# Patient Record
Sex: Male | Born: 1960 | Race: White | Hispanic: No | Marital: Married | Smoking: Never smoker
Health system: Southern US, Community
[De-identification: ages and names within clinical notes are randomized; demographics above are authoritative.]

## PROBLEM LIST (undated history)

## (undated) DIAGNOSIS — K219 Gastro-esophageal reflux disease without esophagitis: Secondary | ICD-10-CM

## (undated) HISTORY — PX: TONSILLECTOMY: SUR1361

## (undated) HISTORY — PX: NASAL SINUS SURGERY: SHX719

---

## 2008-10-10 ENCOUNTER — Ambulatory Visit: Payer: Self-pay | Admitting: Otolaryngology

## 2015-10-16 ENCOUNTER — Other Ambulatory Visit: Payer: Self-pay | Admitting: Orthopedic Surgery

## 2015-10-16 DIAGNOSIS — M79672 Pain in left foot: Secondary | ICD-10-CM

## 2015-10-20 ENCOUNTER — Ambulatory Visit
Admission: RE | Admit: 2015-10-20 | Discharge: 2015-10-20 | Disposition: A | Discharge status: Home / Self Care | Payer: 59 | Source: Ambulatory Visit | Attending: Orthopedic Surgery | Admitting: Orthopedic Surgery

## 2015-10-20 DIAGNOSIS — M79672 Pain in left foot: Secondary | ICD-10-CM | POA: Diagnosis present

## 2015-10-27 ENCOUNTER — Other Ambulatory Visit: Payer: Self-pay | Admitting: Orthopedic Surgery

## 2015-10-27 DIAGNOSIS — M79672 Pain in left foot: Secondary | ICD-10-CM

## 2015-10-27 DIAGNOSIS — Z1389 Encounter for screening for other disorder: Secondary | ICD-10-CM

## 2015-11-13 ENCOUNTER — Ambulatory Visit
Admission: RE | Admit: 2015-11-13 | Discharge: 2015-11-13 | Disposition: A | Discharge status: Home / Self Care | Payer: 59 | Source: Ambulatory Visit | Attending: Orthopedic Surgery | Admitting: Orthopedic Surgery

## 2015-11-13 DIAGNOSIS — Z1389 Encounter for screening for other disorder: Secondary | ICD-10-CM

## 2015-11-13 DIAGNOSIS — M79672 Pain in left foot: Secondary | ICD-10-CM | POA: Diagnosis present

## 2015-11-13 DIAGNOSIS — M25475 Effusion, left foot: Secondary | ICD-10-CM | POA: Diagnosis not present

## 2015-11-13 DIAGNOSIS — M85872 Other specified disorders of bone density and structure, left ankle and foot: Secondary | ICD-10-CM | POA: Insufficient documentation

## 2016-07-05 ENCOUNTER — Encounter
Admission: RE | Admit: 2016-07-05 | Discharge: 2016-07-05 | Disposition: A | Discharge status: Home / Self Care | Payer: 59 | Source: Ambulatory Visit | Attending: Podiatry | Admitting: Podiatry

## 2016-07-05 DIAGNOSIS — Z01818 Encounter for other preprocedural examination: Secondary | ICD-10-CM | POA: Diagnosis present

## 2016-07-05 LAB — BASIC METABOLIC PANEL
ANION GAP: 7 (ref 5–15)
BUN: 15 mg/dL (ref 6–20)
CALCIUM: 9 mg/dL (ref 8.9–10.3)
CHLORIDE: 106 mmol/L (ref 101–111)
CO2: 25 mmol/L (ref 22–32)
CREATININE: 1.07 mg/dL (ref 0.61–1.24)
GFR calc non Af Amer: >60 mL/min (ref >60)
Glucose, Bld: 95 mg/dL (ref 65–99)
Potassium: 4.1 mmol/L (ref 3.5–5.1)
SODIUM: 138 mmol/L (ref 135–145)

## 2016-07-05 LAB — HEMOGLOBIN: Hemoglobin: 15 g/dL (ref 13.0–18.0)

## 2016-07-05 NOTE — Patient Instructions (Signed)
Your procedure is scheduled on: 07/09/16 Report to Day Surgery. 2nd floor medical mall entrance To find out your arrival time please call [phone] between 1PM - 3PM on 07/08/16.  Remember: Instructions that are not followed completely may result in serious medical risk, up to and including death, or upon the discretion of your surgeon and anesthesiologist your surgery may need to be rescheduled.    __X__ 1. Do not eat food or drink liquids after midnight. No gum chewing or hard candies.     __X__ 2. No Alcohol/NO SMOKING for 24 hours before or after surgery.   ____ 3. Bring all medications with you on the day of surgery if instructed.    __X__ 4. Notify your doctor if there is any change in your medical condition     (cold, fever, infections).     Do not wear jewelry, make-up, hairpins, clips or nail polish.  Do not wear lotions, powders, or perfumes.   Do not shave 48 hours prior to surgery. Men may shave face and neck.  Do not bring valuables to the hospital.    [facility] is not responsible for any belongings or valuables.               Contacts, dentures or bridgework may not be worn into surgery.  Leave your suitcase in the car. After surgery it may be brought to your room.  For patients admitted to the hospital, discharge time is determined by your                treatment team.   Patients discharged the day of surgery will not be allowed to drive home.   Please read over the following fact sheets that you were given:    CHG SOAP, PAIN, PRE-OP  ____ Take these medicines the morning of surgery with A SIP OF WATER:    1. NONE  2.   3.   4.  5.  6.  ____ Fleet Enema (as directed)   __X__ Use CHG Soap as directed  ____ Use inhalers on the day of surgery  ____ Stop metformin 2 days prior to surgery    ____ Take 1/2 of usual insulin dose the night before surgery and none on the morning of surgery.   ____ Stop Coumadin/Plavix/aspirin on   ____ Stop  Anti-inflammatories on    ____ Stop supplements until after surgery.    ____ Bring C-Pap to the hospital.

## 2016-07-09 ENCOUNTER — Encounter: Payer: Self-pay | Admitting: Anesthesiology

## 2016-07-09 ENCOUNTER — Ambulatory Visit: Payer: 59 | Admitting: Registered Nurse

## 2016-07-09 ENCOUNTER — Ambulatory Visit
Admission: RE | Admit: 2016-07-09 | Discharge: 2016-07-09 | Disposition: A | Discharge status: Home / Self Care | Payer: 59 | Source: Ambulatory Visit | Attending: Podiatry | Admitting: Podiatry

## 2016-07-09 ENCOUNTER — Encounter
Admission: RE | Disposition: A | Discharge status: Home / Self Care | Payer: Self-pay | Source: Ambulatory Visit | Attending: Podiatry

## 2016-07-09 DIAGNOSIS — G8929 Other chronic pain: Secondary | ICD-10-CM | POA: Diagnosis not present

## 2016-07-09 DIAGNOSIS — M24575 Contracture, left foot: Secondary | ICD-10-CM | POA: Insufficient documentation

## 2016-07-09 DIAGNOSIS — M2042 Other hammer toe(s) (acquired), left foot: Secondary | ICD-10-CM | POA: Insufficient documentation

## 2016-07-09 DIAGNOSIS — M7752 Other enthesopathy of left foot: Secondary | ICD-10-CM | POA: Diagnosis not present

## 2016-07-09 HISTORY — PX: HAMMER TOE SURGERY: SHX385

## 2016-07-09 SURGERY — CORRECTION, HAMMER TOE
Anesthesia: General | Site: Toe | Laterality: Left | Wound class: Clean

## 2016-07-09 MED ORDER — LIDOCAINE HCL (CARDIAC) 20 MG/ML IV SOLN
INTRAVENOUS | Status: DC | PRN
  Administered 2016-07-09: 100 mg via INTRAVENOUS

## 2016-07-09 MED ORDER — ONDANSETRON HCL 4 MG/2ML IJ SOLN: 4.0000 mg | Freq: Once | INTRAMUSCULAR | Status: DC | PRN

## 2016-07-09 MED ORDER — CEFAZOLIN SODIUM-DEXTROSE 2-4 GM/100ML-% IV SOLN
2.0000 g | Freq: Once | INTRAVENOUS | Status: AC
Start: 2016-07-09 — End: 2016-07-09
  Administered 2016-07-09: 2 g via INTRAVENOUS

## 2016-07-09 MED ORDER — ONDANSETRON HCL 4 MG/2ML IJ SOLN
INTRAMUSCULAR | Status: DC | PRN
  Administered 2016-07-09: 4 mg via INTRAVENOUS

## 2016-07-09 MED ORDER — LACTATED RINGERS IV SOLN
INTRAVENOUS | Status: DC
  Administered 2016-07-09: 07:00:00 via INTRAVENOUS

## 2016-07-09 MED ORDER — GLYCOPYRROLATE 0.2 MG/ML IJ SOLN
INTRAMUSCULAR | Status: DC | PRN
  Administered 2016-07-09: 0.2 mg via INTRAVENOUS

## 2016-07-09 MED ORDER — BUPIVACAINE HCL 0.5 % IJ SOLN
INTRAMUSCULAR | Status: DC | PRN
Start: 2016-07-09 — End: 2016-07-09
  Administered 2016-07-09: 8.5 mL

## 2016-07-09 MED ORDER — MIDAZOLAM HCL 2 MG/2ML IJ SOLN
INTRAMUSCULAR | Status: DC | PRN
  Administered 2016-07-09: 2 mg via INTRAVENOUS

## 2016-07-09 MED ORDER — LIDOCAINE HCL (PF) 1 % IJ SOLN
INTRAMUSCULAR | Status: AC
  Filled 2016-07-09: qty 30

## 2016-07-09 MED ORDER — BUPIVACAINE HCL (PF) 0.5 % IJ SOLN
INTRAMUSCULAR | Status: AC
  Filled 2016-07-09: qty 30

## 2016-07-09 MED ORDER — PHENYLEPHRINE HCL 10 MG/ML IJ SOLN
INTRAMUSCULAR | Status: DC | PRN
  Administered 2016-07-09 (×3): 100 ug via INTRAVENOUS
  Administered 2016-07-09 (×2): 50 ug via INTRAVENOUS
  Administered 2016-07-09: 100 ug via INTRAVENOUS

## 2016-07-09 MED ORDER — LIDOCAINE HCL 1 % IJ SOLN
INTRAMUSCULAR | Status: DC | PRN
  Administered 2016-07-09: 3.5 mL

## 2016-07-09 MED ORDER — CEFAZOLIN SODIUM-DEXTROSE 2-4 GM/100ML-% IV SOLN
INTRAVENOUS | Status: AC
  Administered 2016-07-09: 2 g via INTRAVENOUS
  Filled 2016-07-09: qty 100

## 2016-07-09 MED ORDER — FAMOTIDINE 20 MG PO TABS
ORAL_TABLET | ORAL | Status: AC
  Administered 2016-07-09: 20 mg via ORAL
  Filled 2016-07-09: qty 1

## 2016-07-09 MED ORDER — PROPOFOL 10 MG/ML IV BOLUS
INTRAVENOUS | Status: DC | PRN
  Administered 2016-07-09: 180 mg via INTRAVENOUS
  Administered 2016-07-09: 40 mg via INTRAVENOUS
  Administered 2016-07-09: 20 mg via INTRAVENOUS

## 2016-07-09 MED ORDER — OXYCODONE-ACETAMINOPHEN 7.5-325 MG PO TABS: 1.0000 | ORAL_TABLET | ORAL | 0 refills | Status: DC | PRN

## 2016-07-09 MED ORDER — FAMOTIDINE 20 MG PO TABS
20.0000 mg | ORAL_TABLET | Freq: Once | ORAL | Status: AC
  Administered 2016-07-09: 20 mg via ORAL

## 2016-07-09 MED ORDER — FENTANYL CITRATE (PF) 100 MCG/2ML IJ SOLN
INTRAMUSCULAR | Status: DC | PRN
  Administered 2016-07-09: 50 ug via INTRAVENOUS

## 2016-07-09 MED ORDER — FENTANYL CITRATE (PF) 100 MCG/2ML IJ SOLN: 25.0000 ug | INTRAMUSCULAR | Status: DC | PRN

## 2016-07-09 SURGICAL SUPPLY — 42 items
BANDAGE ELASTIC 4 LF NS (GAUZE/BANDAGES/DRESSINGS) ×2 IMPLANT
BANDAGE STRETCH 3X4.1 STRL (GAUZE/BANDAGES/DRESSINGS) ×2 IMPLANT
BLADE MED AGGRESSIVE (BLADE) ×1 IMPLANT
BLADE OSC/SAGITTAL MD 5.5X18 (BLADE) ×2 IMPLANT
BLADE SURG MINI STRL (BLADE) ×2 IMPLANT
BNDG CMPR MED 5X4 ELC HKLP NS (GAUZE/BANDAGES/DRESSINGS) ×1
BNDG ESMARK 4X12 TAN STRL LF (GAUZE/BANDAGES/DRESSINGS) ×2 IMPLANT
BNDG GAUZE 4.5X4.1 6PLY STRL (MISCELLANEOUS) ×2 IMPLANT
CANISTER SUCT 1200ML W/VALVE (MISCELLANEOUS) ×2 IMPLANT
COVER PIN YLW 0.028-062 (MISCELLANEOUS) ×8 IMPLANT
CUFF TOURN 18 STER (MISCELLANEOUS) ×1 IMPLANT
CUFF TOURN DUAL PL 12 NO SLV (MISCELLANEOUS) ×2 IMPLANT
DRAPE FLUOR MINI C-ARM 54X84 (DRAPES) ×2 IMPLANT
DURAPREP 26ML APPLICATOR (WOUND CARE) ×2 IMPLANT
ELECT REM PT RETURN 9FT ADLT (ELECTROSURGICAL) ×2
ELECTRODE REM PT RTRN 9FT ADLT (ELECTROSURGICAL) ×1 IMPLANT
GAUZE PETRO XEROFOAM 1X8 (MISCELLANEOUS) ×2 IMPLANT
GAUZE SPONGE 4X4 12PLY STRL (GAUZE/BANDAGES/DRESSINGS) ×2 IMPLANT
GLOVE BIO SURGEON STRL SZ8 (GLOVE) ×2 IMPLANT
GOWN STRL REUS W/ TWL LRG LVL3 (GOWN DISPOSABLE) ×2 IMPLANT
GOWN STRL REUS W/TWL LRG LVL3 (GOWN DISPOSABLE) ×4
KIT RM TURNOVER STRD PROC AR (KITS) ×2 IMPLANT
LABEL OR SOLS (LABEL) ×2 IMPLANT
NDL FILTER BLUNT 18X1 1/2 (NEEDLE) ×1 IMPLANT
NDL HYPO 25X1 1.5 SAFETY (NEEDLE) ×1 IMPLANT
NEEDLE FILTER BLUNT 18X 1/2SAF (NEEDLE) ×1
NEEDLE FILTER BLUNT 18X1 1/2 (NEEDLE) ×1 IMPLANT
NEEDLE HYPO 25X1 1.5 SAFETY (NEEDLE) ×2 IMPLANT
NS IRRIG 500ML POUR BTL (IV SOLUTION) ×2 IMPLANT
PACK EXTREMITY ARMC (MISCELLANEOUS) ×2 IMPLANT
PAD PREP 24X41 OB/GYN DISP (PERSONAL CARE ITEMS) ×2 IMPLANT
PENCIL ELECTRO HAND CTR (MISCELLANEOUS) ×2 IMPLANT
RASP SM TEAR CROSS CUT (RASP) ×1 IMPLANT
SPLINT FAST PLASTER 5X30 (CAST SUPPLIES) ×1
SPLINT PLASTER CAST FAST 5X30 (CAST SUPPLIES) ×1 IMPLANT
STOCKINETTE STRL 6IN 960660 (GAUZE/BANDAGES/DRESSINGS) ×2 IMPLANT
STRIP CLOSURE SKIN 1/4X4 (GAUZE/BANDAGES/DRESSINGS) ×2 IMPLANT
SUT ETHILON 5-0 FS-2 18 BLK (SUTURE) ×2 IMPLANT
SUT VIC AB 4-0 FS2 27 (SUTURE) ×2 IMPLANT
SYRINGE 10CC LL (SYRINGE) ×2 IMPLANT
WIRE Z .045 C-WIRE SPADE TIP (WIRE) ×2 IMPLANT
WIRE Z .062 C-WIRE SPADE TIP (WIRE) ×1 IMPLANT

## 2016-07-09 NOTE — Anesthesia Procedure Notes (Signed)
Procedure Name: LMA Insertion Performed by: Layci Stenglein Pre-anesthesia Checklist: Patient identified, Patient being monitored, Timeout performed, Emergency Drugs available and Suction available Patient Re-evaluated:Patient Re-evaluated prior to inductionOxygen Delivery Method: Circle system utilized Preoxygenation: Pre-oxygenation with 100% oxygen Intubation Type: IV induction LMA: LMA inserted LMA Size: 4.0 Tube type: Oral Number of attempts: 1 Placement Confirmation: positive ETCO2 and breath sounds checked- equal and bilateral Tube secured with: Tape Dental Injury: Teeth and Oropharynx as per pre-operative assessment        

## 2016-07-09 NOTE — Transfer of Care (Signed)
Immediate Anesthesia Transfer of Care Note  Patient: Thomas Parks  Procedure(s) Performed: Procedure(s): HAMMER TOE CORRECTION LEFT 2ND TOE (Left)  Patient Location: PACU  Anesthesia Type:General  Level of Consciousness: sedated and responds to stimulation  Airway & Oxygen Therapy: Patient Spontanous Breathing and Patient connected to face mask oxygen  Post-op Assessment: Report given to RN and Post -op Vital signs reviewed and stable  Post vital signs: Reviewed and stable  Last Vitals:  Vitals:   07/09/16 0638 07/09/16 0850  BP: 110/77 119/81  Pulse: 64 (!) 58  Resp: 16 11  Temp: 36.8 C     Last Pain:  Vitals:   07/09/16 0638  TempSrc: Tympanic  PainSc: 1          Complications: No apparent anesthesia complications

## 2016-07-09 NOTE — Anesthesia Preprocedure Evaluation (Addendum)
Anesthesia Evaluation  Patient identified by MRN, date of birth, ID band Patient awake    Reviewed: Allergy & Precautions, NPO status , Patient's Chart, lab work & pertinent test results, reviewed documented beta blocker date and time   Airway Mallampati: II  TM Distance: >3 FB     Dental  (+) Chipped   Pulmonary           Cardiovascular      Neuro/Psych    GI/Hepatic   Endo/Other    Renal/GU      Musculoskeletal   Abdominal   Peds  Hematology   Anesthesia Other Findings EKG ok.  Reproductive/Obstetrics                            Anesthesia Physical Anesthesia Plan  ASA: II  Anesthesia Plan: General   Post-op Pain Management:    Induction: Intravenous  Airway Management Planned: LMA  Additional Equipment:   Intra-op Plan:   Post-operative Plan:   Informed Consent: I have reviewed the patients History and Physical, chart, labs and discussed the procedure including the risks, benefits and alternatives for the proposed anesthesia with the patient or authorized representative who has indicated his/her understanding and acceptance.     Plan Discussed with: CRNA  Anesthesia Plan Comments:         Anesthesia Quick Evaluation

## 2016-07-09 NOTE — Anesthesia Postprocedure Evaluation (Signed)
Anesthesia Post Note  Patient: Thomas Parks  Procedure(s) Performed: Procedure(s) (LRB): HAMMER TOE CORRECTION LEFT 2ND TOE (Left)  Patient location during evaluation: PACU Anesthesia Type: General Level of consciousness: awake Pain management: pain level controlled Vital Signs Assessment: post-procedure vital signs reviewed and stable Respiratory status: spontaneous breathing Cardiovascular status: stable Anesthetic complications: no    Last Vitals:  Vitals:   07/09/16 0848 07/09/16 0850  BP:  119/81  Pulse: 71 (!) 58  Resp: 14 11  Temp: 36.5 C     Last Pain:  Vitals:   07/09/16 0638  TempSrc: Tympanic  PainSc: 1                  VAN STAVEREN,Balraj Brayfield

## 2016-07-09 NOTE — H&P (Signed)
H and P has been reviewed and no changes are noted.  

## 2016-07-09 NOTE — Op Note (Signed)
Operative note   Surgeon: Dr. Recardo Evangelist, DPM.    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 Hammertoe correction with K wire fixation through the PIP joint arthrodesis site as well as through the metatarsophalangeal joint.          EBL: Less than 5 cc    Anesthesia:general delivered by the anesthesia team. Local anesthesia was delivered by me preoperatively with 7 cc of 1% lidocaine and Marcaine 0.5% plain mixed half and half. At the end of the case 8 cc of 0.5% Marcaine plain was delivered to the operative site base.    Hemostasis: Ankle tourniquet 250 mmHg pressure for 40 minutes    Specimen: None    Complications: None    Operative indications: Patient is a chronic pain to the second toe and second metatarsophalangeal joints unresponsive to conservative care.    Procedure:  Patient was brought into the OR and placed on the operating table in thesupine position. After anesthesia was obtained theleft lower extremity was prepped and draped in usual sterile fashion.  Operative Report: This time to his directed to the dorsum of the second toe and second metatarsophalangeal joint. Curvilinear incision was made over the MTPJ and extended into 2 semielliptical incisions over the PIPJ. This ellipse of skin was then removed and incision was deepened sharp blunt dissection. Bovied as required. This time to his directed to the extensor tendon and the MTPJ area. Extensor tendon was identified and extensor hood release was performed. The tendon was then retracted laterally and a dorsal and medial capsulotomy were performed over the metatarsophalangeal joint. A McGlamry elevator was then entered in the joint in order to free capsular tissue from any, scarring and contractures in the region to prevent plantar flexion. Appear to be intact plantarly. Next Attention directed to the PIP  joint initially where the extensor tendon was incised transversely reflected proximally and distally and the articular cartilage was removed from both the proximal phalanx head and the middle phalanx base. A 0.045 K wires and drilled through the middle phalanx distally out the end of the toe and then retrograded in the proximal phalanx care to take and remove soft tissue interposition in between the fusion site of the PIPJ. This area was checked and good position of the joint and bone was noted. Small bony prominence on the lateral base of the proximal phalanx was resected and rasped smoothly. At this time it was seen the toe still to contract slightly due to the tight extensor tendon. At this time attention was directed proximal to the MTP joint at the proximal portion of the dorsal incision. Extensor tendon was identified and extensor sheath was released and a Z-plasty lengthening was performed extensor tendon. This was sutured together with 4-0 Vicryl. The tendon sheath was repaired with 4 Vicryl simple interrupted sutures as well. This time the 0.5 K wire was addressed again and the toe was held in a corrected position with slight overcorrection to the plantar flexion of the toe and then removed retrograded into the metatarsal head and shaft. Toe position was seen to be in very good shape at this point. X-rays were taken in good position and correction of the deformity were noted. This time the areas were copiously irrigated and deep superficial fascial layers were closed with 4 Vicryl in continuous stitch. The extensor tendon over the PIP joint  was closed with 3 simple interrupted sutures of 4 Vicryl. Skin was closed in its entirety with a subcuticular 4 Vicryl stitch.  This time there is block 0.5% Marcaine plain at the base of the operative sites. Sterile compressive dressings placed across wound consisting of Steri-Strips gauze 4 x 4's Kling Kerlix. A posterior splint was placed on the left foot leg in the  operating room.    Patient tolerated the procedure and anesthesia well.  Was transported from the OR to the PACU with all vital signs stable and vascular status intact. To be discharged per routine protocol.  Will follow up in approximately 1 week in the outpatient clinic.

## 2016-07-09 NOTE — Anesthesia Postprocedure Evaluation (Signed)
Anesthesia Post Note  Patient: Thomas GrippeMitchell A Parks  Procedure(s) Performed: Procedure(s) (LRB): HAMMER TOE CORRECTION LEFT 2ND TOE (Left)  Patient location during evaluation: PACU Anesthesia Type: General Level of consciousness: awake and alert Pain management: pain level controlled Vital Signs Assessment: post-procedure vital signs reviewed and stable Respiratory status: spontaneous breathing, nonlabored ventilation, respiratory function stable and patient connected to nasal cannula oxygen Cardiovascular status: blood pressure returned to baseline and stable Postop Assessment: no signs of nausea or vomiting Anesthetic complications: no    Last Vitals:  Vitals:   07/09/16 0918 07/09/16 0929  BP: 119/85 115/76  Pulse: 70 64  Resp: (!) 24 16  Temp: 36.6 C 36.2 C    Last Pain:  Vitals:   07/09/16 1000  TempSrc:   PainSc: 0-No pain                 Zeplin Aleshire S

## 2016-07-09 NOTE — Discharge Instructions (Signed)
AMBULATORY SURGERY  DISCHARGE INSTRUCTIONS   1) The drugs that you were given will stay in your system until tomorrow so for the next 24 hours you should not:  A) Drive an automobile B) Make any legal decisions C) Drink any alcoholic beverage   2) You may resume regular meals tomorrow.  Today it is better to start with liquids and gradually work up to solid foods.  You may eat anything you prefer, but it is better to start with liquids, then soup and crackers, and gradually work up to solid foods.   3) Please notify your doctor immediately if you have any unusual bleeding, trouble breathing, redness and pain at the surgery site, drainage, fever, or pain not relieved by medication.    4) Additional Instructions:        Please contact your physician with any problems or Same Day Surgery at [phone], Monday through Friday 6 am to 4 pm, or [place] at [facility] Main number at [phone].[facility] [place] [facility]  POST OPERATIVE INSTRUCTIONS FOR DR. TROXLER AND DR. [facility]   1. Take your medication as prescribed.  Pain medication should be taken only as needed. Recommend taking a pain pill as soon as the anesthesia start to wear off and then as needed.  2. Keep the dressing clean, dry and intact.  3. Keep your foot elevated above the heart level for the first 48 hours.  4. Walking to the bathroom and brief periods of walking are acceptable, unless we have instructed you to be non-weight bearing. Recommend using a walker or assistance for balance during the first couple days of ambulating. Recommend sitting up for at least a minutes in bed before trying to walk to allow blood pressure 2 stabilize  5. Always wear your post-op shoe when walking.  Always use your crutches if you are to be non-weight bearing. Can use crutches or walker for assistance with walking initially.  6. Do not take a shower. Baths are  permissible as long as the foot is kept out of the water. Shower protectors can be purchased at most medical supply store's.  7. Every hour you are awake:  - Bend your knee 15 times. - Flex foot 15 times - Massage calf 15 times  8. Call [facility] [phone]) if any of the following problems occur: - You develop a temperature or fever. - The bandage becomes saturated with blood. - Medication does not stop your pain. - Injury of the foot occurs. - Any symptoms of infection including redness, odor, or red streaks running from wound. -

## 2016-07-09 NOTE — Progress Notes (Signed)
Can wiggle toes   No complaints of pain

## 2017-10-26 ENCOUNTER — Emergency Department: Payer: Managed Care, Other (non HMO)

## 2017-10-26 ENCOUNTER — Other Ambulatory Visit: Payer: Self-pay

## 2017-10-26 ENCOUNTER — Emergency Department
Admission: EM | Admit: 2017-10-26 | Discharge: 2017-10-26 | Disposition: A | Discharge status: Home / Self Care | Payer: Managed Care, Other (non HMO) | Attending: Emergency Medicine | Admitting: Emergency Medicine

## 2017-10-26 ENCOUNTER — Encounter: Payer: Self-pay | Admitting: Emergency Medicine

## 2017-10-26 DIAGNOSIS — Z79899 Other long term (current) drug therapy: Secondary | ICD-10-CM | POA: Insufficient documentation

## 2017-10-26 DIAGNOSIS — R1011 Right upper quadrant pain: Secondary | ICD-10-CM

## 2017-10-26 DIAGNOSIS — K8020 Calculus of gallbladder without cholecystitis without obstruction: Secondary | ICD-10-CM | POA: Insufficient documentation

## 2017-10-26 DIAGNOSIS — R112 Nausea with vomiting, unspecified: Secondary | ICD-10-CM | POA: Diagnosis not present

## 2017-10-26 DIAGNOSIS — R109 Unspecified abdominal pain: Secondary | ICD-10-CM | POA: Diagnosis present

## 2017-10-26 LAB — CBC WITH DIFFERENTIAL/PLATELET
BASOS PCT: 1 %
Basophils Absolute: 0.1 10*3/uL (ref 0–0.1)
Eosinophils Absolute: 0.2 10*3/uL (ref 0–0.7)
Eosinophils Relative: 2 %
HEMATOCRIT: 45.3 % (ref 40.0–52.0)
Hemoglobin: 15.6 g/dL (ref 13.0–18.0)
LYMPHS PCT: 18 %
Lymphs Abs: 2.3 10*3/uL (ref 1.0–3.6)
MCH: 30 pg (ref 26.0–34.0)
MCHC: 34.5 g/dL (ref 32.0–36.0)
MCV: 86.9 fL (ref 80.0–100.0)
MONO ABS: 0.8 10*3/uL (ref 0.2–1.0)
MONOS PCT: 6 %
NEUTROS ABS: 9.8 10*3/uL — AB (ref 1.4–6.5)
Neutrophils Relative %: 73 %
Platelets: 292 10*3/uL (ref 150–440)
RBC: 5.21 MIL/uL (ref 4.40–5.90)
RDW: 13.5 % (ref 11.5–14.5)
WBC: 13.2 10*3/uL — ABNORMAL HIGH (ref 3.8–10.6)

## 2017-10-26 LAB — LIPASE, BLOOD: LIPASE: 34 U/L (ref 11–51)

## 2017-10-26 LAB — COMPREHENSIVE METABOLIC PANEL
ALT: 45 U/L (ref 17–63)
ANION GAP: 13 (ref 5–15)
AST: 41 U/L (ref 15–41)
Albumin: 4.5 g/dL (ref 3.5–5.0)
Alkaline Phosphatase: 91 U/L (ref 38–126)
BILIRUBIN TOTAL: 1 mg/dL (ref 0.3–1.2)
BUN: 17 mg/dL (ref 6–20)
CO2: 22 mmol/L (ref 22–32)
Calcium: 9.5 mg/dL (ref 8.9–10.3)
Chloride: 103 mmol/L (ref 101–111)
Creatinine, Ser: 1.44 mg/dL — ABNORMAL HIGH (ref 0.61–1.24)
GFR calc Af Amer: >60 mL/min (ref >60)
GFR, EST NON AFRICAN AMERICAN: 53 mL/min — AB (ref >60)
Glucose, Bld: 167 mg/dL — ABNORMAL HIGH (ref 65–99)
POTASSIUM: 3.3 mmol/L — AB (ref 3.5–5.1)
Sodium: 138 mmol/L (ref 135–145)
TOTAL PROTEIN: 8.2 g/dL — AB (ref 6.5–8.1)

## 2017-10-26 LAB — TROPONIN I
Troponin I: <0.03 ng/mL (ref <0.03)
Troponin I: <0.03 ng/mL (ref <0.03)

## 2017-10-26 MED ORDER — IOPAMIDOL (ISOVUE-300) INJECTION 61%
100.0000 mL | Freq: Once | INTRAVENOUS | Status: AC | PRN
  Administered 2017-10-26: 100 mL via INTRAVENOUS

## 2017-10-26 MED ORDER — ONDANSETRON 4 MG PO TBDP: 4.0000 mg | ORAL_TABLET | Freq: Three times a day (TID) | ORAL | 0 refills | Status: DC | PRN

## 2017-10-26 MED ORDER — SODIUM CHLORIDE 0.9 % IV BOLUS (SEPSIS)
1000.0000 mL | Freq: Once | INTRAVENOUS | Status: AC
  Administered 2017-10-26: 1000 mL via INTRAVENOUS

## 2017-10-26 MED ORDER — KETOROLAC TROMETHAMINE 30 MG/ML IJ SOLN
30.0000 mg | Freq: Once | INTRAMUSCULAR | Status: AC
  Administered 2017-10-26: 30 mg via INTRAVENOUS
  Filled 2017-10-26: qty 1

## 2017-10-26 MED ORDER — HYDROMORPHONE HCL 1 MG/ML IJ SOLN
INTRAMUSCULAR | Status: AC
  Filled 2017-10-26: qty 1

## 2017-10-26 MED ORDER — HYDROMORPHONE HCL 1 MG/ML IJ SOLN
1.0000 mg | Freq: Once | INTRAMUSCULAR | Status: AC
  Administered 2017-10-26: 1 mg via INTRAVENOUS

## 2017-10-26 MED ORDER — OXYCODONE-ACETAMINOPHEN 5-325 MG PO TABS: 2.0000 | ORAL_TABLET | ORAL | 0 refills | Status: DC | PRN

## 2017-10-26 MED ORDER — HYDROMORPHONE HCL 1 MG/ML IJ SOLN
1.0000 mg | Freq: Once | INTRAMUSCULAR | Status: AC
  Administered 2017-10-26: 1 mg via INTRAVENOUS
  Filled 2017-10-26: qty 1

## 2017-10-26 MED ORDER — MORPHINE SULFATE (PF) 4 MG/ML IV SOLN
4.0000 mg | Freq: Once | INTRAVENOUS | Status: AC
  Administered 2017-10-26: 4 mg via INTRAVENOUS
  Filled 2017-10-26: qty 1

## 2017-10-26 MED ORDER — ONDANSETRON HCL 4 MG/2ML IJ SOLN
4.0000 mg | Freq: Once | INTRAMUSCULAR | Status: AC
  Administered 2017-10-26: 4 mg via INTRAVENOUS
  Filled 2017-10-26: qty 2

## 2017-10-26 NOTE — ED Notes (Signed)
Pt returned from ultrasound

## 2017-10-26 NOTE — ED Provider Notes (Signed)
[facility] Emergency Department Provider Note   ____________________________________________   First MD Initiated Contact with Patient 10/26/17 0016     (approximate)  I have reviewed the triage vital signs and the nursing notes.   HISTORY  Chief Complaint Abdominal Pain    HPI Thomas Parks is a 57 y.o. male who comes into the hospital today with some abdominal pain.  He reports that it started around 9:00 this evening.  The pain is in his right upper quadrant and he says it goes across to the center of his abdomen.  The patient states that he had some spaghetti for dinner.  He has had some nausea and vomiting as well.  He has never had this problem before.  The patient reports that his pain is a 10 out of 10 in intensity.  He came into the hospital today for evaluation of his symptoms.  History reviewed. No pertinent past medical history.  Patient Active Problem List   Diagnosis Date Noted  . Calculus of gallbladder without cholecystitis without obstruction     Past Surgical History:  Procedure Laterality Date  . HAMMER TOE SURGERY Left 07/09/2016   Procedure: HAMMER TOE CORRECTION LEFT 2ND TOE;  Surgeon: Recardo EvangelistMatthew Troxler, DPM;  Location: [facility] ORS;  Service: Podiatry;  Laterality: Left;  . NASAL SINUS SURGERY    . NASAL SINUS SURGERY    . TONSILLECTOMY      Prior to Admission medications   Medication Sig Start Date End Date Taking? Authorizing Provider  b complex vitamins tablet Take 1 tablet by mouth daily.   Yes [provider]  Misc Natural Products (OSTEO BI-FLEX ADV TRIPLE ST PO) Take 1 capsule by mouth daily.   Yes [provider]  vitamin C (ASCORBIC ACID) 500 MG tablet Take 500 mg by mouth daily.   Yes [provider]  ondansetron (ZOFRAN ODT) 4 MG disintegrating tablet Take 1 tablet (4 mg total) by mouth every 8 (eight) hours as needed for nausea or vomiting. 10/26/17   Rebecka ApleyWebster, [name], MD    oxyCODONE-acetaminophen (PERCOCET/ROXICET) 5-325 MG tablet Take 2 tablets by mouth every 4 (four) hours as needed for severe pain. 10/26/17   Rebecka ApleyWebster, [name], MD    Allergies Banana  No family history on file.  Social History Social History   Tobacco Use  . Smoking status: Never Smoker  . Smokeless tobacco: Never Used  Substance Use Topics  . Alcohol use: Yes    Comment: socially  . Drug use: No    Review of Systems  Constitutional: No fever/chills Eyes: No visual changes. ENT: No sore throat. Cardiovascular: Denies chest pain. Respiratory: Denies shortness of breath. Gastrointestinal:  abdominal pain   nausea,  vomiting.  No diarrhea.  No constipation. Genitourinary: Negative for dysuria. Musculoskeletal: Negative for back pain. Skin: Negative for rash. Neurological: Negative for headaches, focal weakness or numbness.  ____________________________________________   PHYSICAL EXAM:  VITAL SIGNS: ED Triage Vitals  Enc Vitals Group     BP 10/26/17 0030 136/82     Pulse Rate 10/26/17 0030 75     Resp 10/26/17 0030 14     Temp --      Temp src --      SpO2 10/26/17 0030 100 %     Weight 10/26/17 0004 190 lb (86.2 kg)     Height 10/26/17 0004 5\' 9"  (1.753 m)     Head Circumference --      Peak Flow --  Pain Score 10/26/17 0004 10     Pain Loc --      Pain Edu? --      Excl. in GC? --     Constitutional: Alert and oriented. Well appearing and in moderate distress. Eyes: Conjunctivae are normal. PERRL. EOMI. Head: Atraumatic. Nose: No congestion/rhinnorhea. Mouth/Throat: Mucous membranes are moist.  Oropharynx non-erythematous. Cardiovascular: Normal rate, regular rhythm. Grossly normal heart sounds.  Good peripheral circulation. Respiratory: Normal respiratory effort.  No retractions. Lungs CTAB. Gastrointestinal: Soft with some RUQ and epigastric pain to palpation. No distention. Positive bowel sounds Musculoskeletal: No lower extremity tenderness  nor edema.   Neurologic:  Normal speech and language.  Skin:  Skin is warm, dry and intact.  Psychiatric: Mood and affect are normal.   ____________________________________________   LABS (all labs ordered are listed, but only abnormal results are displayed)  Labs Reviewed  CBC WITH DIFFERENTIAL/PLATELET - Abnormal; Notable for the following components:      Result Value   WBC 13.2 (*)    Neutro Abs 9.8 (*)    All other components within normal limits  COMPREHENSIVE METABOLIC PANEL - Abnormal; Notable for the following components:   Potassium 3.3 (*)    Glucose, Bld 167 (*)    Creatinine, Ser 1.44 (*)    Total Protein 8.2 (*)    GFR calc non Af Amer 53 (*)    All other components within normal limits  LIPASE, BLOOD  TROPONIN I  TROPONIN I   ____________________________________________  EKG  ED ECG REPORT I, Rebecka Apley, the attending physician, personally viewed and interpreted this ECG.   Date: 10/26/2017  EKG Time: 0012  Rate: 73  Rhythm: normal sinus rhythm  Axis: normal  Intervals:none  ST&T Change: none  ____________________________________________  RADIOLOGY  ED MD interpretation:  [place] abd RUQ: cholelithiasis with no cholecystitis                                      CT abd and pelvis: NAD  Official radiology report(s): Ct Abdomen Pelvis W Contrast  Result Date: 10/26/2017 CLINICAL DATA:  Upper abdominal pain, nausea and vomiting beginning this evening. EXAM: CT ABDOMEN AND PELVIS WITH CONTRAST TECHNIQUE: Multidetector CT imaging of the abdomen and pelvis was performed using the standard protocol following bolus administration of intravenous contrast. CONTRAST:  ISOVUE-300 IOPAMIDOL (ISOVUE-300) INJECTION 61% COMPARISON:  Abdominal ultrasound October 26, 2017 at 0109 hours. FINDINGS: LOWER CHEST: Dependent atelectasis. Included heart size is normal. No pericardial effusion. HEPATOBILIARY: Diffusely hypodense liver compatible with steatosis, mild  focal fatty sparing about the gallbladder fossa. Known gallstones are not apparent by CT. No CT findings of acute cholecystitis. PANCREAS: Normal. SPLEEN: Normal. ADRENALS/URINARY TRACT: Kidneys are orthotopic, demonstrating symmetric enhancement. No nephrolithiasis, hydronephrosis or solid renal masses. The unopacified ureters are normal in course and caliber. Delayed imaging through the kidneys demonstrates symmetric prompt contrast excretion within the proximal urinary collecting system. Urinary bladder is partially distended and unremarkable. Normal adrenal glands. STOMACH/BOWEL: The stomach, small and large bowel are normal in course and caliber without inflammatory changes. A few scattered colonic diverticula evident. Small probable duodenal diverticulum anteriorly directed. Normal appendix. VASCULAR/LYMPHATIC: Aortoiliac vessels are normal in course and caliber. No lymphadenopathy by CT size criteria. REPRODUCTIVE: Prostatomegaly invading base of bladder. OTHER: No intraperitoneal free fluid or free air. Small bilateral fat containing inguinal hernias. MUSCULOSKELETAL: Nonacute. IMPRESSION: 1. No acute intra-or  pelvic process. Known cholelithiasis not apparent by CT. 2. Prostatomegaly. Electronically Signed   By: Awilda Metro M.D.   On: 10/26/2017 03:12   US Abdomen Limited Ruq  Result Date: 10/26/2017 CLINICAL DATA:  Upper abdominal pain with nausea and vomiting x4 hours. EXAM: ULTRASOUND ABDOMEN LIMITED RIGHT UPPER QUADRANT COMPARISON:  None. FINDINGS: Gallbladder: Layering gallstones and biliary sludge are identified within the physiologically distended gallbladder. No pericholecystic fluid is noted. No wall thickening is seen. Patient was medicated and therefore accurate assessment of a sonographic [name] sign was limited. Common bile duct: Diameter: 3.5 mm and normal Liver: No focal lesion identified. Within normal limits in parenchymal echogenicity. Portal vein is patent on color Doppler  imaging with normal direction of blood flow towards the liver. IMPRESSION: Uncomplicated cholelithiasis and biliary sludge. Electronically Signed   By: [name] M.D.   On: 10/26/2017 02:00    ____________________________________________   PROCEDURES  Procedure(s) performed: None  Procedures  Critical Care performed: No  ____________________________________________   INITIAL IMPRESSION / ASSESSMENT AND PLAN / ED COURSE  As part of my medical decision making, I reviewed the following data within the electronic MEDICAL RECORD NUMBER Notes from prior ED visits and [place] Controlled Substance Database   This is a 57 year old male who comes into the hospital today with some abdominal pain.  My differential diagnosis includes cholecystitis, cholelithiasis, pancreatitis  The patient initially arrived in some severe pain.  We first gave him some morphine and Zofran and then gave him a milligram of Dilaudid.  The patient continued to have pain so he received a second dose of Dilaudid.  The patient was sent for an ultrasound.  After he returned he still had some pain so he received 1/3 mg of Dilaudid.  The patient's ultrasound showed uncomplicated cholelithiasis.  I then sent the patient for a CT scan since he had been having so much significant pain.  The patient CT scan was unremarkable.  The patient does have a white blood cell count of 13.2 but the remaining blood work is unremarkable.  I also checked 2 troponins which were negative.  The patient will be discharged home to follow-up with surgery.  I did have the patient discuss his symptoms with Dr. Everlene Farrier, but the patient's pain was improved at that time.  The patient will follow up with him in the office.      ____________________________________________   FINAL CLINICAL IMPRESSION(S) / ED DIAGNOSES  Final diagnoses:  Abdominal pain  Right upper quadrant abdominal pain  Calculus of gallbladder without cholecystitis without obstruction    Non-intractable vomiting with nausea, unspecified vomiting type     ED Discharge Orders        Ordered    oxyCODONE-acetaminophen (PERCOCET/ROXICET) 5-325 MG tablet  Every 4 hours PRN     10/26/17 0520    ondansetron (ZOFRAN ODT) 4 MG disintegrating tablet  Every 8 hours PRN     10/26/17 0520       Note:  This document was prepared using Dragon voice recognition software and may include unintentional dictation errors.    Rebecka Apley, MD 10/26/17 [phone]

## 2017-10-26 NOTE — Discharge Instructions (Signed)
Please follow up with surgery for further evaluation of your pain. Please return if the pain returns

## 2017-10-26 NOTE — ED Triage Notes (Signed)
Patient ambulatory to triage with steady gait, without difficulty; pt appears very uncomfortable, grimacing, restless; c/o upper abd pain accomp by N/V since 9pm; denies hx of same

## 2017-10-26 NOTE — ED Notes (Signed)
Pt went to ultrasound.

## 2017-10-26 NOTE — Consult Note (Addendum)
Patient ID: Thomas Parks, male   DOB: 1960/09/14, 57 y.o.   MRN: [id number]  HPI Thomas Parks is a 57 y.o. male asked to see in consultation by Dr. Zenda AlpersWebster ( d/w her the case). HE presented with a few hours of epigastric and RUQ pain, severe, lasted for a couple of hours, subsided after IV narcotics were given in the ER. Pain now has resolved. Developed nausea, emesis and decrease appetite. No fevers, no biliary obstruction.  CT and U/S personally reviewed, GS, no cholecystitis, nml CBD. WBC mildly elevated w hemoconcentration and inc in creat upt o 1.4, nml lfts. He is able to perform more than 4 METS w/o SOB or C/P. No previous abd operations.  HPI  History reviewed. No pertinent past medical history.  Past Surgical History:  Procedure Laterality Date  . HAMMER TOE SURGERY Left 07/09/2016   Procedure: HAMMER TOE CORRECTION LEFT 2ND TOE;  Surgeon: Recardo EvangelistMatthew Troxler, DPM;  Location: [facility] ORS;  Service: Podiatry;  Laterality: Left;  . NASAL SINUS SURGERY    . NASAL SINUS SURGERY    . TONSILLECTOMY      No pertinent fam hx  Social History Social History   Tobacco Use  . Smoking status: Never Smoker  . Smokeless tobacco: Never Used  Substance Use Topics  . Alcohol use: Yes    Comment: socially  . Drug use: No    Allergies  Allergen Reactions  . Banana Other (See Comments)    Reaction: abdominal pain    No current facility-administered medications for this encounter.    Current Outpatient Medications  Medication Sig Dispense Refill  . b complex vitamins tablet Take 1 tablet by mouth daily.    . Misc Natural Products (OSTEO BI-FLEX ADV TRIPLE ST PO) Take 1 capsule by mouth daily.    . vitamin C (ASCORBIC ACID) 500 MG tablet Take 500 mg by mouth daily.       Review of Systems Full ROS  was asked and was negative except for the information on the HPI  Physical Exam Blood pressure 140/81, pulse 61, resp. rate 16, height 5\' 9"  (1.753 m), weight 86.2 kg (190 lb),  SpO2 100 %. CONSTITUTIONAL: NAD EYES: Pupils are equal, round, and reactive to light, Sclera are non-icteric. EARS, NOSE, MOUTH AND THROAT: The oropharynx is clear. The oral mucosa is pink and moist. Hearing is intact to voice. LYMPH NODES:  Lymph nodes in the neck are normal. RESPIRATORY:  Lungs are clear. There is normal respiratory effort, with equal breath sounds bilaterally, and without pathologic use of accessory muscles. CARDIOVASCULAR: Heart is regular without murmurs, gallops, or rubs. GI: The abdomen is soft, nontender, and nondistended. There are no palpable masses. There is no hepatosplenomegaly. There are normal bowel sounds in all quadrants. GU: Rectal deferred.   MUSCULOSKELETAL: Normal muscle strength and tone. No cyanosis or edema.   SKIN: Turgor is good and there are no pathologic skin lesions or ulcers. NEUROLOGIC: Motor and sensation is grossly normal. Cranial nerves are grossly intact. PSYCH:  Oriented to person, place and time. Affect is normal.  Data Reviewed I have personally reviewed the patient's imaging, laboratory findings and medical records.    Assessment/Plan Biary colic now resolved. No evidence of cholecystitis on imaging studies or clinical findings. I will order extra liter of crystalloids, I will be happy to see him in the office next week and schedule a lap chole within the next few weeks. I do think this can be managed as an  outpt. No need for emergent surgical intervention. Extensive counseling provided to him and his wife.    Sterling Big, MD FACS General Surgeon 10/26/2017, 4:28 AM

## 2017-10-26 NOTE — ED Notes (Signed)
Pt went to CT

## 2017-10-28 ENCOUNTER — Encounter: Payer: Self-pay | Admitting: Surgery

## 2017-10-28 ENCOUNTER — Ambulatory Visit (INDEPENDENT_AMBULATORY_CARE_PROVIDER_SITE_OTHER): Payer: Managed Care, Other (non HMO) | Admitting: Surgery

## 2017-10-28 VITALS — BP 116/74 | HR 73 | Temp 97.7°F | Ht 69.0 in | Wt 190.8 lb

## 2017-10-28 DIAGNOSIS — K8020 Calculus of gallbladder without cholecystitis without obstruction: Secondary | ICD-10-CM | POA: Diagnosis not present

## 2017-10-28 NOTE — H&P (View-Only) (Signed)
Surgical Clinic History and Physical  Referring provider:  No referring provider defined for this encounter.  HISTORY OF PRESENT ILLNESS (HPI):  57 y.o. male presents for evaluation of severe RUQ abdominal pain. Patient reports he developed severe RUQ abdominal pain 2 days ago after eating spaghetti with meatsauce and cheese. When his pain did not improve, he sought evaluation at [facility] ED, at which time workup included an ultrasound demonstrating cholelithiasis without cholecystitis. Patient says his pain improved with pain medication, and he was accordingly discharged home with instructions to follow up in outpatient surgical clinic. Since then, he describes he has continued to notice mild recurrent RUQ abdominal pain with fatty foods, not relieved by oxycodone. Patient otherwise denies any fever/chills, N/V, constipation, diarrhea, CP, or SOB.  PAST MEDICAL HISTORY (PMH):  History reviewed. No pertinent past medical history.   PAST SURGICAL HISTORY (PSH):  Past Surgical History:  Procedure Laterality Date  . HAMMER TOE SURGERY Left 07/09/2016   Procedure: HAMMER TOE CORRECTION LEFT 2ND TOE;  Surgeon: Recardo EvangelistMatthew Troxler, DPM;  Location: [facility] ORS;  Service: Podiatry;  Laterality: Left;  . NASAL SINUS SURGERY    . NASAL SINUS SURGERY    . TONSILLECTOMY       MEDICATIONS:  Prior to Admission medications   Medication Sig Start Date End Date Taking? Authorizing Provider  b complex vitamins tablet Take 1 tablet by mouth daily.   Yes [provider]  Misc Natural Products (OSTEO BI-FLEX ADV TRIPLE ST PO) Take 1 capsule by mouth daily.   Yes [provider]  ondansetron (ZOFRAN ODT) 4 MG disintegrating tablet Take 1 tablet (4 mg total) by mouth every 8 (eight) hours as needed for nausea or vomiting. 10/26/17  Yes Rebecka ApleyWebster, [name], MD  oxyCODONE-acetaminophen (PERCOCET/ROXICET) 5-325 MG tablet Take 2 tablets by mouth every 4 (four) hours as needed for severe pain. 10/26/17  Yes  Rebecka ApleyWebster, [name], MD  vitamin C (ASCORBIC ACID) 500 MG tablet Take 500 mg by mouth daily.   Yes [provider]     ALLERGIES:  Allergies  Allergen Reactions  . Banana Other (See Comments)    Reaction: abdominal pain     SOCIAL HISTORY:  Social History   Socioeconomic History  . Marital status: Married    Spouse name: Not on file  . Number of children: Not on file  . Years of education: Not on file  . Highest education level: Not on file  Social Needs  . Financial resource strain: Not on file  . Food insecurity - worry: Not on file  . Food insecurity - inability: Not on file  . Transportation needs - medical: Not on file  . Transportation needs - non-medical: Not on file  Occupational History  . Not on file  Tobacco Use  . Smoking status: Never Smoker  . Smokeless tobacco: Never Used  Substance and Sexual Activity  . Alcohol use: Yes    Comment: socially  . Drug use: No  . Sexual activity: Not on file  Other Topics Concern  . Not on file  Social History Narrative  . Not on file    The patient currently resides (home / rehab facility / nursing home): Home The patient normally is (ambulatory / bedbound): Ambulatory  FAMILY HISTORY:  Family History  Problem Relation Age of Onset  . Asthma Mother   . COPD Mother   . Heart disease Father   . Heart failure Father     Otherwise negative/non-contributory.  REVIEW OF  SYSTEMS:  Constitutional: denies any other weight loss, fever, chills, or sweats  Eyes: denies any other vision changes, history of eye injury  ENT: denies sore throat, hearing problems  Respiratory: denies shortness of breath, wheezing  Cardiovascular: denies chest pain, palpitations  Gastrointestinal: abdominal pain, N/V, and bowel function as per HPI Musculoskeletal: denies any other joint pains or cramps  Skin: Denies any other rashes or skin discolorations Neurological: denies any other headache, dizziness, weakness  Psychiatric:  Denies any other depression, anxiety   All other review of systems were otherwise negative   VITAL SIGNS:  BP 116/74   Pulse 73   Temp 97.7 F (36.5 C) (Oral)   Ht 5\' 9"  (1.753 m)   Wt 190 lb 12.8 oz (86.5 kg)   BMI 28.18 kg/m   PHYSICAL EXAM:  Constitutional:  -- Normal body habitus  -- Awake, alert, and oriented x3  Eyes:  -- Pupils equally round and reactive to light  -- No scleral icterus  Ear, nose, throat:  -- No jugular venous distension -- No nasal drainage, bleeding Pulmonary:  -- No crackles  -- Equal breath sounds bilaterally -- Breathing non-labored at rest Cardiovascular:  -- S1, S2 present  -- No pericardial rubs  Gastrointestinal:  -- Abdomen soft and non-distended with mild RUQ abdominal tenderness to palpation, no guarding/rebound  -- No abdominal masses appreciated, pulsatile or otherwise  Musculoskeletal and Integumentary:  -- Wounds or skin discoloration: None appreciated -- Extremities: B/L UE and LE FROM, hands and feet warm, no edema  Neurologic:  -- Motor function: Intact and symmetric -- Sensation: Intact and symmetric  Labs:  CBC    Component Value Date/Time   WBC 13.2 (H) 10/26/2017 0015   RBC 5.21 10/26/2017 0015   HGB 15.6 10/26/2017 0015   HCT 45.3 10/26/2017 0015   PLT 292 10/26/2017 0015   MCV 86.9 10/26/2017 0015   MCH 30.0 10/26/2017 0015   MCHC 34.5 10/26/2017 0015   RDW 13.5 10/26/2017 0015   LYMPHSABS 2.3 10/26/2017 0015   MONOABS 0.8 10/26/2017 0015   EOSABS 0.2 10/26/2017 0015   BASOSABS 0.1 10/26/2017 0015   CMP Latest Ref Rng & Units 10/26/2017 07/05/2016  Glucose 65 - 99 mg/dL 454(U) 95  BUN 6 - 20 mg/dL 17 15  Creatinine 9.81 - 1.24 mg/dL 1.91(Y) 7.82  Sodium 956 - 145 mmol/L 138 138  Potassium 3.5 - 5.1 mmol/L 3.3(L) 4.1  Chloride 101 - 111 mmol/L 103 106  CO2 22 - 32 mmol/L 22 25  Calcium 8.9 - 10.3 mg/dL 9.5 9.0  Total Protein 6.5 - 8.1 g/dL 8.2(H) -  Total Bilirubin 0.3 - 1.2 mg/dL 1.0 -  Alkaline Phos  38 - 126 U/L 91 -  AST 15 - 41 U/L 41 -  ALT 17 - 63 U/L 45 -   Imaging studies:  Limited RUQ Abdominal Ultrasound (10/26/2017) - personally reviewed and discussed with patient Layering gallstones and biliary sludge are identified within the distended gallbladder. No pericholecystic fluid is noted. No wall  thickening is seen. Patient was medicated and therefore accurate  assessment of a sonographic Murphy's sign was limited. Common bile duct diameter measures 3.5 mm.  Assessment/Plan:  57 y.o. male with symptomatic cholelithiasis.   - avoid/minimize foods with higher fat content (meats, cheeses/dairy, and fried)  - prefer low-fat vegetables, whole grains (wheat bread, ceareals, etc), and fruits until cholecystectomy   - all risks, benefits, and alternatives to cholecystectomy were discussed with the patient, all of his  questions were answered to his expressed satisfaction, patient expresses he wishes to proceed, and informed consent was obtained.  - will plan for laparoscopic cholecystectomy next week per patient request pending anesthesia and OR availability  - anticipate return to clinic 2 weeks after above planned surgery  - instructed to call if any questions or concerns  All of the above recommendations were discussed with the patient, and all of patient's questions were answered to his expressed satisfaction.  Thank you for the opportunity to participate in this patient's care.  -- Scherrie Gerlach Earlene Plater, MD, RPVI : [facility] General Surgery - Partnering for exceptional care. Office: [phone]

## 2017-10-28 NOTE — Progress Notes (Signed)
Surgical Clinic History and Physical  Referring provider:  No referring provider defined for this encounter.  HISTORY OF PRESENT ILLNESS (HPI):  57 y.o. male presents for evaluation of severe RUQ abdominal pain. Patient reports he developed severe RUQ abdominal pain 2 days ago after eating spaghetti with meatsauce and cheese. When his pain did not improve, he sought evaluation at [facility] ED, at which time workup included an ultrasound demonstrating cholelithiasis without cholecystitis. Patient says his pain improved with pain medication, and he was accordingly discharged home with instructions to follow up in outpatient surgical clinic. Since then, he describes he has continued to notice mild recurrent RUQ abdominal pain with fatty foods, not relieved by oxycodone. Patient otherwise denies any fever/chills, N/V, constipation, diarrhea, CP, or SOB.  PAST MEDICAL HISTORY (PMH):  History reviewed. No pertinent past medical history.   PAST SURGICAL HISTORY (PSH):  Past Surgical History:  Procedure Laterality Date  . HAMMER TOE SURGERY Left 07/09/2016   Procedure: HAMMER TOE CORRECTION LEFT 2ND TOE;  Surgeon: Recardo EvangelistMatthew Troxler, DPM;  Location: [facility] ORS;  Service: Podiatry;  Laterality: Left;  . NASAL SINUS SURGERY    . NASAL SINUS SURGERY    . TONSILLECTOMY       MEDICATIONS:  Prior to Admission medications   Medication Sig Start Date End Date Taking? Authorizing Provider  b complex vitamins tablet Take 1 tablet by mouth daily.   Yes [provider]  Misc Natural Products (OSTEO BI-FLEX ADV TRIPLE ST PO) Take 1 capsule by mouth daily.   Yes [provider]  ondansetron (ZOFRAN ODT) 4 MG disintegrating tablet Take 1 tablet (4 mg total) by mouth every 8 (eight) hours as needed for nausea or vomiting. 10/26/17  Yes Rebecka ApleyWebster, [name], MD  oxyCODONE-acetaminophen (PERCOCET/ROXICET) 5-325 MG tablet Take 2 tablets by mouth every 4 (four) hours as needed for severe pain. 10/26/17  Yes  Rebecka ApleyWebster, [name], MD  vitamin C (ASCORBIC ACID) 500 MG tablet Take 500 mg by mouth daily.   Yes [provider]     ALLERGIES:  Allergies  Allergen Reactions  . Banana Other (See Comments)    Reaction: abdominal pain     SOCIAL HISTORY:  Social History   Socioeconomic History  . Marital status: Married    Spouse name: Not on file  . Number of children: Not on file  . Years of education: Not on file  . Highest education level: Not on file  Social Needs  . Financial resource strain: Not on file  . Food insecurity - worry: Not on file  . Food insecurity - inability: Not on file  . Transportation needs - medical: Not on file  . Transportation needs - non-medical: Not on file  Occupational History  . Not on file  Tobacco Use  . Smoking status: Never Smoker  . Smokeless tobacco: Never Used  Substance and Sexual Activity  . Alcohol use: Yes    Comment: socially  . Drug use: No  . Sexual activity: Not on file  Other Topics Concern  . Not on file  Social History Narrative  . Not on file    The patient currently resides (home / rehab facility / nursing home): Home The patient normally is (ambulatory / bedbound): Ambulatory  FAMILY HISTORY:  Family History  Problem Relation Age of Onset  . Asthma Mother   . COPD Mother   . Heart disease Father   . Heart failure Father     Otherwise negative/non-contributory.  REVIEW OF  SYSTEMS:  Constitutional: denies any other weight loss, fever, chills, or sweats  Eyes: denies any other vision changes, history of eye injury  ENT: denies sore throat, hearing problems  Respiratory: denies shortness of breath, wheezing  Cardiovascular: denies chest pain, palpitations  Gastrointestinal: abdominal pain, N/V, and bowel function as per HPI Musculoskeletal: denies any other joint pains or cramps  Skin: Denies any other rashes or skin discolorations Neurological: denies any other headache, dizziness, weakness  Psychiatric:  Denies any other depression, anxiety   All other review of systems were otherwise negative   VITAL SIGNS:  BP 116/74   Pulse 73   Temp 97.7 F (36.5 C) (Oral)   Ht 5\' 9"  (1.753 m)   Wt 190 lb 12.8 oz (86.5 kg)   BMI 28.18 kg/m   PHYSICAL EXAM:  Constitutional:  -- Normal body habitus  -- Awake, alert, and oriented x3  Eyes:  -- Pupils equally round and reactive to light  -- No scleral icterus  Ear, nose, throat:  -- No jugular venous distension -- No nasal drainage, bleeding Pulmonary:  -- No crackles  -- Equal breath sounds bilaterally -- Breathing non-labored at rest Cardiovascular:  -- S1, S2 present  -- No pericardial rubs  Gastrointestinal:  -- Abdomen soft and non-distended with mild RUQ abdominal tenderness to palpation, no guarding/rebound  -- No abdominal masses appreciated, pulsatile or otherwise  Musculoskeletal and Integumentary:  -- Wounds or skin discoloration: None appreciated -- Extremities: B/L UE and LE FROM, hands and feet warm, no edema  Neurologic:  -- Motor function: Intact and symmetric -- Sensation: Intact and symmetric  Labs:  CBC    Component Value Date/Time   WBC 13.2 (H) 10/26/2017 0015   RBC 5.21 10/26/2017 0015   HGB 15.6 10/26/2017 0015   HCT 45.3 10/26/2017 0015   PLT 292 10/26/2017 0015   MCV 86.9 10/26/2017 0015   MCH 30.0 10/26/2017 0015   MCHC 34.5 10/26/2017 0015   RDW 13.5 10/26/2017 0015   LYMPHSABS 2.3 10/26/2017 0015   MONOABS 0.8 10/26/2017 0015   EOSABS 0.2 10/26/2017 0015   BASOSABS 0.1 10/26/2017 0015   CMP Latest Ref Rng & Units 10/26/2017 07/05/2016  Glucose 65 - 99 mg/dL 454(U) 95  BUN 6 - 20 mg/dL 17 15  Creatinine 9.81 - 1.24 mg/dL 1.91(Y) 7.82  Sodium 956 - 145 mmol/L 138 138  Potassium 3.5 - 5.1 mmol/L 3.3(L) 4.1  Chloride 101 - 111 mmol/L 103 106  CO2 22 - 32 mmol/L 22 25  Calcium 8.9 - 10.3 mg/dL 9.5 9.0  Total Protein 6.5 - 8.1 g/dL 8.2(H) -  Total Bilirubin 0.3 - 1.2 mg/dL 1.0 -  Alkaline Phos  38 - 126 U/L 91 -  AST 15 - 41 U/L 41 -  ALT 17 - 63 U/L 45 -   Imaging studies:  Limited RUQ Abdominal Ultrasound (10/26/2017) - personally reviewed and discussed with patient Layering gallstones and biliary sludge are identified within the distended gallbladder. No pericholecystic fluid is noted. No wall  thickening is seen. Patient was medicated and therefore accurate  assessment of a sonographic Murphy's sign was limited. Common bile duct diameter measures 3.5 mm.  Assessment/Plan:  57 y.o. male with symptomatic cholelithiasis.   - avoid/minimize foods with higher fat content (meats, cheeses/dairy, and fried)  - prefer low-fat vegetables, whole grains (wheat bread, ceareals, etc), and fruits until cholecystectomy   - all risks, benefits, and alternatives to cholecystectomy were discussed with the patient, all of his  questions were answered to his expressed satisfaction, patient expresses he wishes to proceed, and informed consent was obtained.  - will plan for laparoscopic cholecystectomy next week per patient request pending anesthesia and OR availability  - anticipate return to clinic 2 weeks after above planned surgery  - instructed to call if any questions or concerns  All of the above recommendations were discussed with the patient, and all of patient's questions were answered to his expressed satisfaction.  Thank you for the opportunity to participate in this patient's care.  -- Scherrie Gerlach Earlene Plater, MD, RPVI [place]: [facility] General Surgery - Partnering for exceptional care. Office: [phone]

## 2017-10-28 NOTE — Patient Instructions (Addendum)
You have requested to have your gallbladder removed. This will be done at [facility] with Dr. Satira MccallumJason Davis. We will call you with the surgery date.   You will most likely be out of work 1-2 weeks for this surgery. You will return after your post-op appointment with a lifting restriction for approximately 4 more weeks.  You will be able to eat anything you would like to following surgery. But, start by eating a bland diet and advance this as tolerated. The Gallbladder diet is below, please go as closely by this diet as possible prior to surgery to avoid any further attacks.  Please see the (blue)pre-care form that you have been given today. If you have any questions, please call our office.  Laparoscopic Cholecystectomy Laparoscopic cholecystectomy is surgery to remove the gallbladder. The gallbladder is located in the upper right part of the abdomen, behind the liver. It is a storage sac for bile, which is produced in the liver. Bile aids in the digestion and absorption of fats. Cholecystectomy is often done for inflammation of the gallbladder (cholecystitis). This condition is usually caused by a buildup of gallstones (cholelithiasis) in the gallbladder. Gallstones can block the flow of bile, and that can result in inflammation and pain. In severe cases, emergency surgery may be required. If emergency surgery is not required, you will have time to prepare for the procedure. Laparoscopic surgery is an alternative to open surgery. Laparoscopic surgery has a shorter recovery time. Your common bile duct may also need to be examined during the procedure. If stones are found in the common bile duct, they may be removed. LET [facility] KNOW ABOUT:  Any allergies you have.  All medicines you are taking, including vitamins, herbs, eye drops, creams, and over-the-counter medicines.  Previous problems you or members of your family have had with the use of anesthetics.  Any blood  disorders you have.  Previous surgeries you have had.    Any medical conditions you have. RISKS AND COMPLICATIONS Generally, this is a safe procedure. However, problems may occur, including:  Infection.  Bleeding.  Allergic reactions to medicines.  Damage to other structures or organs.  A stone remaining in the common bile duct.  A bile leak from the cyst duct that is clipped when your gallbladder is removed.  The need to convert to open surgery, which requires a larger incision in the abdomen. This may be necessary if your surgeon thinks that it is not safe to continue with a laparoscopic procedure. BEFORE THE PROCEDURE  Ask your health care provider about:  Changing or stopping your regular medicines. This is especially important if you are taking diabetes medicines or blood thinners.  Taking medicines such as aspirin and ibuprofen. These medicines can thin your blood. Do not take these medicines before your procedure if your health care provider instructs you not to.  Follow instructions from your health care provider about eating or drinking restrictions.  Let your health care provider know if you develop a cold or an infection before surgery.  Plan to have someone take you home after the procedure.  Ask your health care provider how your surgical site will be marked or identified.  You may be given antibiotic medicine to help prevent infection. PROCEDURE  To reduce your risk of infection:  Your health care team will wash or sanitize their hands.  Your skin will be washed with soap.  An IV tube may be inserted into one of your veins.  You will be given a medicine to make you fall asleep (general anesthetic).  A breathing tube will be placed in your mouth.  The surgeon will make several small cuts (incisions) in your abdomen.  A thin, lighted tube (laparoscope) that has a tiny camera on the end will be inserted through one of the small incisions. The camera  on the laparoscope will send a picture to a TV screen (monitor) in the operating room. This will give the surgeon a good view inside your abdomen.  A gas will be pumped into your abdomen. This will expand your abdomen to give the surgeon more room to perform the surgery.  Other tools that are needed for the procedure will be inserted through the other incisions. The gallbladder will be removed through one of the incisions.  After your gallbladder has been removed, the incisions will be closed with stitches (sutures), staples, or skin glue.  Your incisions may be covered with a bandage (dressing). The procedure may vary among health care providers and hospitals. AFTER THE PROCEDURE  Your blood pressure, heart rate, breathing rate, and blood oxygen level will be monitored often until the medicines you were given have worn off.  You will be given medicines as needed to control your pain.   This information is not intended to replace advice given to you by your health care provider. Make sure you discuss any questions you have with your health care provider.   Document Released: 08/30/2005 Document Revised: 05/21/2015 Document Reviewed: 04/11/2013 Elsevier Interactive Patient Education 2016 Elsevier Inc[employer]   Low-Fat Diet for Gallbladder Conditions A low-fat diet can be helpful if you have pancreatitis or a gallbladder condition. With these conditions, your pancreas and gallbladder have trouble digesting fats. A healthy eating plan with less fat will help rest your pancreas and gallbladder and reduce your symptoms. WHAT DO I NEED TO KNOW ABOUT THIS DIET?  Eat a low-fat diet.  Reduce your fat intake to less than 20-30% of your total daily calories. This is less than 50-60 g of fat per day.  Remember that you need some fat in your diet. Ask your dietician what your daily goal should be.  Choose nonfat and low-fat healthy foods. Look for the words "nonfat," "low fat," or "fat free."  As a  guide, look on the label and choose foods with less than 3 g of fat per serving. Eat only one serving.  Avoid alcohol.  Do not smoke. If you need help quitting, talk with your health care provider.  Eat small frequent meals instead of three large heavy meals. WHAT FOODS CAN I EAT? Grains Include healthy grains and starches such as potatoes, wheat bread, fiber-rich cereal, and brown rice. Choose whole grain options whenever possible. In adults, whole grains should account for 45-65% of your daily calories.  Fruits and Vegetables Eat plenty of fruits and vegetables. Fresh fruits and vegetables add fiber to your diet. Meats and Other Protein Sources Eat lean meat such as chicken and pork. Trim any fat off of meat before cooking it. Eggs, fish, and beans are other sources of protein. In adults, these foods should account for 10-35% of your daily calories. Dairy Choose low-fat milk and dairy options. Dairy includes fat and protein, as well as calcium.  Fats and Oils Limit high-fat foods such as fried foods, sweets, baked goods, sugary drinks.  Other Creamy sauces and condiments, such as mayonnaise, can add extra fat. Think about whether or not you need to use them,  or use smaller amounts or low fat options. WHAT FOODS ARE NOT RECOMMENDED?  High fat foods, such as:  [employer].  Ice cream.  [place] toast.  Sweet rolls.  Pizza.  Cheese bread.  Foods covered with batter, butter, creamy sauces, or cheese.  Fried foods.  Sugary drinks and desserts.  Foods that cause gas or bloating   This information is not intended to replace advice given to you by your health care provider. Make sure you discuss any questions you have with your health care provider.   Document Released: 09/04/2013 Document Reviewed: 09/04/2013 Elsevier Interactive Patient Education 2016 Elsevier

## 2017-10-30 ENCOUNTER — Encounter: Payer: Self-pay | Admitting: Surgery

## 2017-10-31 ENCOUNTER — Other Ambulatory Visit: Payer: Self-pay

## 2017-10-31 ENCOUNTER — Encounter
Admission: RE | Admit: 2017-10-31 | Discharge: 2017-10-31 | Disposition: A | Discharge status: Home / Self Care | Payer: Managed Care, Other (non HMO) | Source: Ambulatory Visit | Attending: Surgery | Admitting: Surgery

## 2017-10-31 DIAGNOSIS — K82A1 Gangrene of gallbladder in cholecystitis: Secondary | ICD-10-CM | POA: Diagnosis not present

## 2017-10-31 DIAGNOSIS — K8012 Calculus of gallbladder with acute and chronic cholecystitis without obstruction: Secondary | ICD-10-CM | POA: Diagnosis not present

## 2017-10-31 DIAGNOSIS — Z79899 Other long term (current) drug therapy: Secondary | ICD-10-CM | POA: Diagnosis not present

## 2017-10-31 DIAGNOSIS — K8010 Calculus of gallbladder with chronic cholecystitis without obstruction: Secondary | ICD-10-CM | POA: Diagnosis present

## 2017-10-31 HISTORY — DX: Gastro-esophageal reflux disease without esophagitis: K21.9

## 2017-10-31 LAB — POTASSIUM: POTASSIUM: 3.6 mmol/L (ref 3.5–5.1)

## 2017-10-31 MED ORDER — CEFAZOLIN SODIUM-DEXTROSE 2-4 GM/100ML-% IV SOLN
2.0000 g | INTRAVENOUS | Status: AC
  Administered 2017-11-01: 2 g via INTRAVENOUS

## 2017-10-31 NOTE — Pre-Procedure Instructions (Signed)
EKG  ED ECG REPORT I, Thomas Parks, the attending physician, personally viewed and interpreted this ECG.   Date: 10/26/2017  EKG Time: 0012  Rate: 73  Rhythm: normal sinus rhythm  Axis: normal  Intervals:none  ST&T Change: none  ____________________________________________  RADIOLOGY  ED MD interpretation:  [place] abd RUQ: cholelithiasis with no cholecystitis                                      CT abd and pelvis: NAD  Official radiology report(s): Ct Abdomen Pelvis W Contrast  Result Date: 10/26/2017 CLINICAL DATA:  Upper abdominal pain, nausea and vomiting beginning this evening. EXAM: CT ABDOMEN AND PELVIS WITH CONTRAST TECHNIQUE: Multidetector CT imaging of the abdomen and pelvis was performed using the standard protocol following bolus administration of intravenous contrast. CONTRAST:  ISOVUE-300 IOPAMIDOL (ISOVUE-300) INJECTION 61% COMPARISON:  Abdominal ultrasound October 26, 2017 at 0109 hours. FINDINGS: LOWER CHEST: Dependent atelectasis. Included heart size is normal. No pericardial effusion. HEPATOBILIARY: Diffusely hypodense liver compatible with steatosis, mild focal fatty sparing about the gallbladder fossa. Known gallstones are not apparent by CT. No CT findings of acute cholecystitis. PANCREAS: Normal. SPLEEN: Normal. ADRENALS/URINARY TRACT: Kidneys are orthotopic, demonstrating symmetric enhancement. No nephrolithiasis, hydronephrosis or solid renal masses. The unopacified ureters are normal in course and caliber. Delayed imaging through the kidneys demonstrates symmetric prompt contrast excretion within the proximal urinary collecting system. Urinary bladder is partially distended and unremarkable. Normal adrenal glands. STOMACH/BOWEL: The stomach, small and large bowel are normal in course and caliber without inflammatory changes. A few scattered colonic diverticula evident. Small probable duodenal diverticulum anteriorly directed. Normal appendix.  VASCULAR/LYMPHATIC: Aortoiliac vessels are normal in course and caliber. No lymphadenopathy by CT size criteria. REPRODUCTIVE: Prostatomegaly invading base of bladder. OTHER: No intraperitoneal free fluid or free air. Small bilateral fat containing inguinal hernias. MUSCULOSKELETAL: Nonacute. IMPRESSION: 1. No acute intra-or pelvic process. Known cholelithiasis not apparent by CT. 2. Prostatomegaly. Electronically Signed   By: Awilda Metro M.D.   On: 10/26/2017 03:12   US Abdomen Limited Ruq  Result Date: 10/26/2017 CLINICAL DATA:  Upper abdominal pain with nausea and vomiting x4 hours. EXAM: ULTRASOUND ABDOMEN LIMITED RIGHT UPPER QUADRANT COMPARISON:  None. FINDINGS: Gallbladder: Layering gallstones and biliary sludge are identified within the physiologically distended gallbladder. No pericholecystic fluid is noted. No wall thickening is seen. Patient was medicated and therefore accurate assessment of a sonographic [name] sign was limited. Common bile duct: Diameter: 3.5 mm and normal Liver: No focal lesion identified. Within normal limits in parenchymal echogenicity. Portal vein is patent on color Doppler imaging with normal direction of blood flow towards the liver. IMPRESSION: Uncomplicated cholelithiasis and biliary sludge. Electronically Signed   By: [name] M.D.   On: 10/26/2017 02:00    ____________________________________________   PROCEDURES  Procedure(s) performed: None  Procedures  Critical Care performed: No  ____________________________________________   INITIAL IMPRESSION / ASSESSMENT AND PLAN / ED COURSE  As part of my medical decision making, I reviewed the following data within the electronic MEDICAL RECORD NUMBER Notes from prior ED visits and [place] Controlled Substance Database   This is a 57 year old male who comes into the hospital today with some abdominal pain.  My differential diagnosis includes cholecystitis, cholelithiasis,  pancreatitis  The patient initially arrived in some severe pain.  We first gave him some morphine and Zofran and then gave him a  milligram of Dilaudid.  The patient continued to have pain so he received a second dose of Dilaudid.  The patient was sent for an ultrasound.  After he returned he still had some pain so he received 1/3 mg of Dilaudid.  The patient's ultrasound showed uncomplicated cholelithiasis.  I then sent the patient for a CT scan since he had been having so much significant pain.  The patient CT scan was unremarkable.  The patient does have a white blood cell count of 13.2 but the remaining blood work is unremarkable.  I also checked 2 troponins which were negative.  The patient will be discharged home to follow-up with surgery.  I did have the patient discuss his symptoms with Dr. [name], but the patient's pain was improved at that time.  The patient will follow up with him in the office.    ____________________________________________   FINAL CLINICAL IMPRESSION(S) / ED DIAGNOSES  Final diagnoses:  Abdominal pain  Right upper quadrant abdominal pain  Calculus of gallbladder without cholecystitis without obstruction  Non-intractable vomiting with nausea, unspecified vomiting type          ED Discharge Orders        Ordered    oxyCODONE-acetaminophen (PERCOCET/ROXICET) 5-325 MG tablet  Every 4 hours PRN     10/26/17 0520    ondansetron (ZOFRAN ODT) 4 MG disintegrating tablet  Every 8 hours PRN     10/26/17 0520       Note:  This document was prepared using Dragon voice recognition software and may include unintentional dictation errors.    Thomas ApleyWebster, [name], MD 10/26/17 [phone]           Electronically signed by Thomas ApleyWebster, [name], MD at 10/26/2017 5:29 AM     ED on 10/26/2017        Detailed Report

## 2017-10-31 NOTE — Patient Instructions (Signed)
Your procedure is scheduled on: 11-01-17 TUESDAY Report to Same Day Surgery 2nd floor medical mall [facility] Entrance-take elevator on left to 2nd floor.  Check in with surgery information desk.) To find out your arrival time please call [phone] between 1PM - 3PM on 10-31-17 MONDAY  Remember: Instructions that are not followed completely may result in serious medical risk, up to and including death, or upon the discretion of your surgeon and anesthesiologist your surgery may need to be rescheduled.    _x___ 1. Do not eat food after midnight the night before your procedure. NO GUM OR CANDY AFTER MIDNIGHT.  You may drink clear liquids up to 2 hours before you are scheduled to arrive at the hospital for your procedure.  Do not drink clear liquids within 2 hours of your scheduled arrival to the hospital.  Clear liquids include  --Water or Apple juice without pulp  --Clear carbohydrate beverage such as ClearFast or Gatorade  --Black Coffee or Clear Tea (No milk, no creamers, do not add anything to the coffee or Tea    __x__ 2. No Alcohol for 24 hours before or after surgery.   __x__3. No Smoking or e-cigarettes for 24 prior to surgery.  Do not use any chewable tobacco products for at least 6 hour prior to surgery   ____  4. Bring all medications with you on the day of surgery if instructed.    __x__ 5. Notify your doctor if there is any change in your medical condition     (cold, fever, infections).    x___6. On the morning of surgery brush your teeth with toothpaste and water.  You may rinse your mouth with mouth wash if you wish.  Do not swallow any toothpaste or mouthwash.   Do not wear jewelry, make-up, hairpins, clips or nail polish.  Do not wear lotions, powders, or perfumes. You may wear deodorant.  Do not shave 48 hours prior to surgery. Men may shave face and neck.  Do not bring valuables to the hospital.    [facility] is not responsible for any belongings or  valuables.               Contacts, dentures or bridgework may not be worn into surgery.  Leave your suitcase in the car. After surgery it may be brought to your room.  For patients admitted to the hospital, discharge time is determined by your treatment team.  _  Patients discharged the day of surgery will not be allowed to drive home.  You will need someone to drive you home and stay with you the night of your procedure.    Please read over the following fact sheets that you were given:   [facility] Preparing for Surgery and or MRSA Information   _x___ TAKE THE FOLLOWING MEDICATION THE MORNING OF SURGERY WITH A SMALL SIP OF WATER. These include:  1. YOU MAY TAKE PERCOCET IF NEEDED AM OF SURGERY WITH A SMALL SIP OF WATER  2.  3.  4.  5.  6.  ____Fleets enema or Magnesium Citrate as directed.   _x___ Use CHG Soap or sage wipes as directed on instruction sheet   ____ Use inhalers on the day of surgery and bring to hospital day of surgery  ____ Stop Metformin and Janumet 2 days prior to surgery.    ____ Take 1/2 of usual insulin dose the night before surgery and none on the morning surgery.   ____ Follow recommendations from Cardiologist,  Pulmonologist or PCP regarding  stopping Aspirin, Coumadin, Plavix ,Eliquis, Effient, or Pradaxa, and Pletal.  X____Stop Anti-inflammatories such as Advil, Aleve, Ibuprofen, Motrin, Naproxen, Naprosyn, Goodies powders or aspirin products. OK to take Tylenol OR PERCOCET IF NEEDED   _x___ Stop supplements until after surgery-STOP VITAMIN C AND OSTEO-BI FLEX NOW-MAY RESUME AFTER SURGERY   ____ Bring C-Pap to the hospital.

## 2017-11-01 ENCOUNTER — Ambulatory Visit
Admission: RE | Admit: 2017-11-01 | Discharge: 2017-11-01 | Disposition: A | Discharge status: Home / Self Care | Payer: Managed Care, Other (non HMO) | Source: Ambulatory Visit | Attending: Surgery | Admitting: Surgery

## 2017-11-01 ENCOUNTER — Other Ambulatory Visit: Payer: Self-pay

## 2017-11-01 ENCOUNTER — Ambulatory Visit: Payer: Managed Care, Other (non HMO) | Admitting: Certified Registered"

## 2017-11-01 ENCOUNTER — Encounter
Admission: RE | Disposition: A | Discharge status: Home / Self Care | Payer: Self-pay | Source: Ambulatory Visit | Attending: Surgery

## 2017-11-01 ENCOUNTER — Encounter: Payer: Self-pay | Admitting: *Deleted

## 2017-11-01 DIAGNOSIS — K8012 Calculus of gallbladder with acute and chronic cholecystitis without obstruction: Secondary | ICD-10-CM | POA: Diagnosis not present

## 2017-11-01 DIAGNOSIS — Z79899 Other long term (current) drug therapy: Secondary | ICD-10-CM | POA: Insufficient documentation

## 2017-11-01 DIAGNOSIS — K82A1 Gangrene of gallbladder in cholecystitis: Secondary | ICD-10-CM | POA: Insufficient documentation

## 2017-11-01 HISTORY — PX: CHOLECYSTECTOMY: SHX55

## 2017-11-01 SURGERY — LAPAROSCOPIC CHOLECYSTECTOMY
Anesthesia: General

## 2017-11-01 MED ORDER — PROPOFOL 10 MG/ML IV BOLUS
INTRAVENOUS | Status: DC | PRN
  Administered 2017-11-01: 170 mg via INTRAVENOUS

## 2017-11-01 MED ORDER — PROMETHAZINE HCL 25 MG/ML IJ SOLN
INTRAMUSCULAR | Status: AC
  Administered 2017-11-01: 6.25 mg
  Filled 2017-11-01: qty 1

## 2017-11-01 MED ORDER — OXYCODONE-ACETAMINOPHEN 5-325 MG PO TABS: 1.0000 | ORAL_TABLET | ORAL | 0 refills | Status: DC | PRN

## 2017-11-01 MED ORDER — LIDOCAINE HCL (CARDIAC) 20 MG/ML IV SOLN
INTRAVENOUS | Status: DC | PRN
  Administered 2017-11-01: 100 mg via INTRAVENOUS

## 2017-11-01 MED ORDER — ONDANSETRON HCL 4 MG/2ML IJ SOLN
INTRAMUSCULAR | Status: DC | PRN
  Administered 2017-11-01: 4 mg via INTRAVENOUS

## 2017-11-01 MED ORDER — CHLORHEXIDINE GLUCONATE CLOTH 2 % EX PADS: 6.0000 | MEDICATED_PAD | Freq: Once | CUTANEOUS | Status: DC

## 2017-11-01 MED ORDER — SODIUM CHLORIDE 0.9 % IJ SOLN
INTRAMUSCULAR | Status: AC
  Administered 2017-11-01: 12:00:00
  Filled 2017-11-01: qty 10

## 2017-11-01 MED ORDER — SUCCINYLCHOLINE CHLORIDE 20 MG/ML IJ SOLN
INTRAMUSCULAR | Status: AC
  Filled 2017-11-01: qty 1

## 2017-11-01 MED ORDER — FENTANYL CITRATE (PF) 100 MCG/2ML IJ SOLN
INTRAMUSCULAR | Status: DC | PRN
  Administered 2017-11-01 (×2): 50 ug via INTRAVENOUS
  Administered 2017-11-01: 100 ug via INTRAVENOUS

## 2017-11-01 MED ORDER — PROPOFOL 10 MG/ML IV BOLUS
INTRAVENOUS | Status: AC
  Filled 2017-11-01: qty 20

## 2017-11-01 MED ORDER — MIDAZOLAM HCL 2 MG/2ML IJ SOLN
INTRAMUSCULAR | Status: DC | PRN
  Administered 2017-11-01: 2 mg via INTRAVENOUS

## 2017-11-01 MED ORDER — LIDOCAINE HCL (PF) 2 % IJ SOLN
INTRAMUSCULAR | Status: AC
  Filled 2017-11-01: qty 10

## 2017-11-01 MED ORDER — PROMETHAZINE HCL 25 MG/ML IJ SOLN
6.2500 mg | Freq: Once | INTRAMUSCULAR | Status: AC
  Administered 2017-11-01: 25 mg via INTRAVENOUS

## 2017-11-01 MED ORDER — LIDOCAINE HCL (PF) 1 % IJ SOLN
INTRAMUSCULAR | Status: AC
  Filled 2017-11-01: qty 30

## 2017-11-01 MED ORDER — LACTATED RINGERS IV SOLN
INTRAVENOUS | Status: DC
  Administered 2017-11-01: 07:00:00 via INTRAVENOUS

## 2017-11-01 MED ORDER — ONDANSETRON HCL 4 MG/2ML IJ SOLN
4.0000 mg | Freq: Once | INTRAMUSCULAR | Status: AC | PRN
  Administered 2017-11-01: 4 mg via INTRAVENOUS

## 2017-11-01 MED ORDER — CEFAZOLIN SODIUM-DEXTROSE 2-4 GM/100ML-% IV SOLN
INTRAVENOUS | Status: AC
  Filled 2017-11-01: qty 100

## 2017-11-01 MED ORDER — ROCURONIUM BROMIDE 50 MG/5ML IV SOLN
INTRAVENOUS | Status: AC
  Filled 2017-11-01: qty 1

## 2017-11-01 MED ORDER — OXYCODONE-ACETAMINOPHEN 5-325 MG PO TABS
ORAL_TABLET | ORAL | Status: AC
  Filled 2017-11-01: qty 1

## 2017-11-01 MED ORDER — PHENYLEPHRINE HCL 10 MG/ML IJ SOLN
INTRAMUSCULAR | Status: AC
  Filled 2017-11-01: qty 1

## 2017-11-01 MED ORDER — BUPIVACAINE HCL (PF) 0.5 % IJ SOLN
INTRAMUSCULAR | Status: AC
  Filled 2017-11-01: qty 30

## 2017-11-01 MED ORDER — SUGAMMADEX SODIUM 200 MG/2ML IV SOLN
INTRAVENOUS | Status: DC | PRN
  Administered 2017-11-01: 180 mg via INTRAVENOUS

## 2017-11-01 MED ORDER — KETOROLAC TROMETHAMINE 30 MG/ML IJ SOLN
INTRAMUSCULAR | Status: DC | PRN
  Administered 2017-11-01: 30 mg via INTRAVENOUS

## 2017-11-01 MED ORDER — ROCURONIUM BROMIDE 100 MG/10ML IV SOLN
INTRAVENOUS | Status: DC | PRN
  Administered 2017-11-01 (×3): 5 mg via INTRAVENOUS
  Administered 2017-11-01: 40 mg via INTRAVENOUS
  Administered 2017-11-01: 5 mg via INTRAVENOUS

## 2017-11-01 MED ORDER — FENTANYL CITRATE (PF) 100 MCG/2ML IJ SOLN
25.0000 ug | INTRAMUSCULAR | Status: DC | PRN
  Administered 2017-11-01 (×2): 25 ug via INTRAVENOUS

## 2017-11-01 MED ORDER — OXYCODONE-ACETAMINOPHEN 5-325 MG PO TABS
1.0000 | ORAL_TABLET | ORAL | Status: DC | PRN
  Administered 2017-11-01: 1 via ORAL

## 2017-11-01 MED ORDER — ONDANSETRON HCL 4 MG/2ML IJ SOLN
INTRAMUSCULAR | Status: AC
  Filled 2017-11-01: qty 2

## 2017-11-01 MED ORDER — FAMOTIDINE 20 MG PO TABS
ORAL_TABLET | ORAL | Status: AC
  Administered 2017-11-01: 20 mg via ORAL
  Filled 2017-11-01: qty 1

## 2017-11-01 MED ORDER — MIDAZOLAM HCL 2 MG/2ML IJ SOLN
INTRAMUSCULAR | Status: AC
  Filled 2017-11-01: qty 2

## 2017-11-01 MED ORDER — FENTANYL CITRATE (PF) 100 MCG/2ML IJ SOLN
INTRAMUSCULAR | Status: AC
  Administered 2017-11-01: 25 ug via INTRAVENOUS
  Filled 2017-11-01: qty 2

## 2017-11-01 MED ORDER — ACETAMINOPHEN 10 MG/ML IV SOLN
INTRAVENOUS | Status: AC
  Filled 2017-11-01: qty 100

## 2017-11-01 MED ORDER — ACETAMINOPHEN 10 MG/ML IV SOLN
INTRAVENOUS | Status: DC | PRN
  Administered 2017-11-01: 1000 mg via INTRAVENOUS

## 2017-11-01 MED ORDER — LIDOCAINE HCL 1 % IJ SOLN
INTRAMUSCULAR | Status: DC | PRN
  Administered 2017-11-01: 20 mL via INTRADERMAL

## 2017-11-01 MED ORDER — SUCCINYLCHOLINE CHLORIDE 20 MG/ML IJ SOLN
INTRAMUSCULAR | Status: DC | PRN
  Administered 2017-11-01: 100 mg via INTRAVENOUS

## 2017-11-01 MED ORDER — FENTANYL CITRATE (PF) 100 MCG/2ML IJ SOLN
INTRAMUSCULAR | Status: AC
  Filled 2017-11-01: qty 4

## 2017-11-01 MED ORDER — FAMOTIDINE 20 MG PO TABS
20.0000 mg | ORAL_TABLET | Freq: Once | ORAL | Status: AC
  Administered 2017-11-01: 20 mg via ORAL

## 2017-11-01 MED ORDER — SUGAMMADEX SODIUM 200 MG/2ML IV SOLN
INTRAVENOUS | Status: AC
  Filled 2017-11-01: qty 2

## 2017-11-01 SURGICAL SUPPLY — 37 items
ADH SKN CLS APL DERMABOND .7 (GAUZE/BANDAGES/DRESSINGS) ×1
APPLIER CLIP ROT 10 11.4 M/L (STAPLE) ×2
APR CLP MED LRG 11.4X10 (STAPLE) ×1
BAG SPEC RTRVL LRG 6X4 10 (ENDOMECHANICALS) ×1
CHLORAPREP W/TINT 26ML (MISCELLANEOUS) ×2 IMPLANT
CLIP APPLIE ROT 10 11.4 M/L (STAPLE) ×1 IMPLANT
DECANTER SPIKE VIAL GLASS SM (MISCELLANEOUS) ×2 IMPLANT
DERMABOND ADVANCED (GAUZE/BANDAGES/DRESSINGS) ×1
DERMABOND ADVANCED .7 DNX12 (GAUZE/BANDAGES/DRESSINGS) ×1 IMPLANT
DRESSING SURGICEL FIBRLLR 1X2 (HEMOSTASIS) IMPLANT
DRSG SURGICEL FIBRILLAR 1X2 (HEMOSTASIS)
ELECT REM PT RETURN 9FT ADLT (ELECTROSURGICAL) ×2
ELECTRODE REM PT RTRN 9FT ADLT (ELECTROSURGICAL) ×1 IMPLANT
GLOVE BIO SURGEON STRL SZ7 (GLOVE) ×2 IMPLANT
GLOVE BIOGEL PI IND STRL 7.5 (GLOVE) ×1 IMPLANT
GLOVE BIOGEL PI INDICATOR 7.5 (GLOVE) ×1
GOWN STRL REUS W/ TWL LRG LVL3 (GOWN DISPOSABLE) ×3 IMPLANT
GOWN STRL REUS W/TWL LRG LVL3 (GOWN DISPOSABLE) ×6
GRASPER SUT TROCAR 14GX15 (MISCELLANEOUS) ×2 IMPLANT
IRRIGATION STRYKERFLOW (MISCELLANEOUS) IMPLANT
IRRIGATOR STRYKERFLOW (MISCELLANEOUS)
IV NS 1000ML (IV SOLUTION)
IV NS 1000ML BAXH (IV SOLUTION) ×1 IMPLANT
KIT TURNOVER KIT A (KITS) ×2 IMPLANT
NEEDLE HYPO 22GX1.5 SAFETY (NEEDLE) ×2 IMPLANT
NEEDLE INSUFFLATION 14GA 120MM (NEEDLE) ×2 IMPLANT
NS IRRIG 1000ML POUR BTL (IV SOLUTION) ×2 IMPLANT
PACK LAP CHOLECYSTECTOMY (MISCELLANEOUS) ×2 IMPLANT
POUCH SPECIMEN RETRIEVAL 10MM (ENDOMECHANICALS) ×2 IMPLANT
SCISSORS METZENBAUM CVD 33 (INSTRUMENTS) IMPLANT
SLEEVE ENDOPATH XCEL 5M (ENDOMECHANICALS) ×4 IMPLANT
SUT MNCRL AB 4-0 PS2 18 (SUTURE) ×2 IMPLANT
SUT VICRYL 0 UR6 27IN ABS (SUTURE) ×2 IMPLANT
SUT VICRYL AB 3-0 FS1 BRD 27IN (SUTURE) ×2 IMPLANT
TROCAR XCEL NON-BLD 11X100MML (ENDOMECHANICALS) ×2 IMPLANT
TROCAR XCEL NON-BLD 5MMX100MML (ENDOMECHANICALS) ×2 IMPLANT
TUBING INSUFFLATION (TUBING) ×2 IMPLANT

## 2017-11-01 NOTE — Anesthesia Postprocedure Evaluation (Signed)
Anesthesia Post Note  Patient: Thomas Parks  Procedure(s) Performed: LAPAROSCOPIC CHOLECYSTECTOMY (N/A )  Patient location during evaluation: PACU Anesthesia Type: General Level of consciousness: awake and alert and oriented Pain management: pain level controlled Vital Signs Assessment: post-procedure vital signs reviewed and stable Respiratory status: spontaneous breathing Cardiovascular status: blood pressure returned to baseline Anesthetic complications: no     Last Vitals:  Vitals:   11/01/17 1132 11/01/17 1252  BP: 132/73 127/70  Pulse: 65 (!) 57  Resp:  16  Temp:  36.4 C  SpO2: 100% 100%    Last Pain:  Vitals:   11/01/17 1252  TempSrc: Temporal  PainSc:                  Rissie Sculley

## 2017-11-01 NOTE — OR Nursing (Signed)
Advises nausea "better now", denies pain, sleepy at present.

## 2017-11-01 NOTE — Anesthesia Preprocedure Evaluation (Signed)
Anesthesia Evaluation  Patient identified by MRN, date of birth, ID band Patient awake    Reviewed: Allergy & Precautions, NPO status , Patient's Chart, lab work & pertinent test results, reviewed documented beta blocker date and time   Airway Mallampati: III  TM Distance: >3 FB     Dental  (+) Chipped   Pulmonary neg pulmonary ROS,    Pulmonary exam normal        Cardiovascular negative cardio ROS Normal cardiovascular exam     Neuro/Psych negative neurological ROS  negative psych ROS   GI/Hepatic Neg liver ROS, GERD  Controlled,  Endo/Other  negative endocrine ROS  Renal/GU negative Renal ROS  negative genitourinary   Musculoskeletal negative musculoskeletal ROS (+)   Abdominal Normal abdominal exam  (+)   Peds negative pediatric ROS (+)  Hematology negative hematology ROS (+)   Anesthesia Other Findings EKG ok.  Reproductive/Obstetrics                             Anesthesia Physical  Anesthesia Plan  ASA: II  Anesthesia Plan: General   Post-op Pain Management:    Induction: Intravenous  PONV Risk Score and Plan:   Airway Management Planned: Oral ETT  Additional Equipment:   Intra-op Plan:   Post-operative Plan: Extubation in OR  Informed Consent: I have reviewed the patients History and Physical, chart, labs and discussed the procedure including the risks, benefits and alternatives for the proposed anesthesia with the patient or authorized representative who has indicated his/her understanding and acceptance.     Plan Discussed with: CRNA and Surgeon  Anesthesia Plan Comments:         Anesthesia Quick Evaluation

## 2017-11-01 NOTE — Anesthesia Procedure Notes (Signed)
Procedure Name: Intubation Performed by: Lance Muss, CRNA Pre-anesthesia Checklist: Patient identified, Patient being monitored, Timeout performed, Emergency Drugs available and Suction available Patient Re-evaluated:Patient Re-evaluated prior to induction Oxygen Delivery Method: Circle system utilized Preoxygenation: Pre-oxygenation with 100% oxygen Induction Type: IV induction Ventilation: Mask ventilation without difficulty Laryngoscope Size: McGraph and 4 Grade View: Grade II Tube type: Oral Tube size: 7.5 mm Number of attempts: 2 Airway Equipment and Method: Stylet and Video-laryngoscopy Placement Confirmation: ETT inserted through vocal cords under direct vision,  positive ETCO2 and breath sounds checked- equal and bilateral Secured at: 24 cm Tube secured with: Tape Dental Injury: Teeth and Oropharynx as per pre-operative assessment  Difficulty Due To: Difficult Airway- due to anterior larynx Future Recommendations: Recommend- induction with short-acting agent, and alternative techniques readily available Comments: Attempted first with MAC 3 with grade 4 view. Second attempt with Mcgraph 4 with grade 2 view, successfully passed ETT through cords. +BBS, +EtCO2.

## 2017-11-01 NOTE — Anesthesia Post-op Follow-up Note (Signed)
Anesthesia QCDR form completed.        

## 2017-11-01 NOTE — OR Nursing (Signed)
Awake, ambulatory to bathroom for void, toler well, IV d/c'd and discharged to home.

## 2017-11-01 NOTE — Op Note (Signed)
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 male who recently presented with post-prandial RUQ > epigastric abdominal pain after eating fatty foods in particular. Though patient says his pain was controlled in the ED at that time with pain medications, but otherwise has persisted (though he has also continued to eat the same fatty foods that precipitated his pain), ultrasound suggested cholelithiasis without sonographic evidence of cholecystitis. All risks, benefits, and alternatives to above elective procedures were discussed with the patient, who elected to proceed, and informed consent was accordingly obtained at that time.   DETAILS OF PROCEDURE:  Patient was brought to the operating suite and appropriately identified. General anesthesia was administered along with peri-operative prophylactic IV antibiotics, and endotracheal intubation was performed by anesthesiologist, along with NG/OG tube for gastric decompression. In supine position, operative site was prepped and draped in usual sterile fashion, and following a brief time out, initial 5 mm incision was made in a natural skin crease just above the umbilicus. Fascia was then elevated, and a Verress needle was inserted  and its proper position confirmed using aspiration and saline meniscus test.  Upon insufflation of the abdominal cavity with carbon dioxide to a well-tolerated pressure of 12-15 mmHg, 5 mm peri-umbilical port followed by laparoscope were inserted and used to inspect the abdominal cavity and its contents with no injuries from insertion of the first trochar noted. Three additional trocars were inserted, one at the epigastric position (10 mm) and two along the Right costal margin (5 mm). The table was then placed in reverse Trendelenburg position with the Right side up. Filmy adhesions between the gallbladder and omentum/duodenum/transverse colon were lysed using combined blunt dissection and selective electrocautery. The apex/dome of the gallbladder was grasped with an atraumatic grasper passed through the lateral port and retracted apically over the liver. The infundibulum was also grasped and retracted, exposing Calot's triangle. The peritoneum overlying the gallbladder infundibulum was incised and dissected free of surrounding peritoneal attachments, revealing the cystic duct and cystic artery, which were clipped twice on the patient side and once on the gallbladder specimen side close to the gallbladder. The gallbladder was then dissected from its peritoneal attachments to the liver using electrocautery, and the gallbladder was placed into a laparoscopic specimen bag and removed from the abdominal cavity via the epigastric port site. Hemostasis and secure placement of clips were confirmed, and intra-peritoneal cavity was inspected with no additional findings. PMI laparoscopic fascial closure device was then used to re-approximate fascia at the 10 mm epigastric port site.  All ports were then removed under direct visualization, and abdominal cavity was desuflated. All port sites were irrigated/cleaned, additional local anesthetic was injected at each incision, 3-0 Vicryl was used to re-approximate dermis at 10  mm port site(s), and subcuticular 4-0 Monocryl suture was used to re-approximate skin. Skin was then cleaned, dried, and sterile skin glue was applied. Patient was then safely able to be  awakened, extubated, and transferred to PACU for post-operative monitoring and care.   I was present for all aspects of procedure, and there were no intra-operative complications apparent.

## 2017-11-01 NOTE — Interval H&P Note (Signed)
History and Physical Interval Note:  11/01/2017 7:11 AM  Thomas Parks  has presented today for surgery, with the diagnosis of CALCULUS OF GALLBLADDER  The various methods of treatment have been discussed with the patient and family. After consideration of risks, benefits and other options for treatment, the patient has consented to  Procedure(s): LAPAROSCOPIC CHOLECYSTECTOMY (N/A) as a surgical intervention .  The patient's history has been reviewed, patient examined, no change in status, stable for surgery.  I have reviewed the patient's chart and labs.  Questions were answered to the patient's satisfaction.     Ancil LinseyJason Evan Hendry Speas

## 2017-11-01 NOTE — Transfer of Care (Signed)
Immediate Anesthesia Transfer of Care Note  Patient: Thomas Parks  Procedure(s) Performed: LAPAROSCOPIC CHOLECYSTECTOMY (N/A )  Patient Location: PACU  Anesthesia Type:General  Level of Consciousness: sedated  Airway & Oxygen Therapy: Patient Spontanous Breathing and Patient connected to face mask oxygen  Post-op Assessment: Report given to RN and Post -op Vital signs reviewed and stable  Post vital signs: Reviewed and stable  Last Vitals:  Vitals:   11/01/17 0600 11/01/17 0954  BP: 121/82 106/60  Pulse: 70 (!) 59  Resp: 17 (!) 9  Temp: 36.6 C   SpO2: 99% 98%    Last Pain:  Vitals:   11/01/17 0600  TempSrc: Tympanic         Complications: No apparent anesthesia complications

## 2017-11-01 NOTE — Discharge Instructions (Addendum)
In addition to included general post-operative instructions for Laparoscopic Cholecystectomy,  Diet: Resume home heart healthy diet (as discussed).   Activity: No heavy lifting >20 pounds (children, pets, laundry, garbage) or strenuous activity until follow-up, but light activity and walking are encouraged. Do not drive or drink alcohol if taking narcotic pain medications.  Wound care: 2 days after surgery (Thursday, 2/21), may shower/get incision wet with soapy water and pat dry (do not rub incisions), but no baths or submerging incision underwater until follow-up.   Medications: Resume all home medications. For mild to moderate pain: acetaminophen (Tylenol) or ibuprofen/naproxen (if no kidney disease). Combining Tylenol with alcohol can substantially increase your risk of causing liver disease. Narcotic pain medications, if prescribed, can be used for severe pain, though may cause nausea, constipation, and drowsiness. Do not combine Tylenol and Percocet (or similar) within a 6 hour period as Percocet (and similar) contain(s) Tylenol. If you do not need the narcotic pain medication, you do not need to fill the prescription.  Call office [phone]) at any time if any questions, worsening pain, fevers/chills, bleeding, drainage from incision site, or other concerns.    AMBULATORY SURGERY  DISCHARGE INSTRUCTIONS   1) The drugs that you were given will stay in your system until tomorrow so for the next 24 hours you should not:  A) Drive an automobile B) Make any legal decisions C) Drink any alcoholic beverage   2) You may resume regular meals tomorrow.  Today it is better to start with liquids and gradually work up to solid foods.  You may eat anything you prefer, but it is better to start with liquids, then soup and crackers, and gradually work up to solid foods.   3) Please notify your doctor immediately if you have any unusual bleeding, trouble breathing, redness and pain at the  surgery site, drainage, fever, or pain not relieved by medication.    4) Additional Instructions:        Please contact your physician with any problems or Same Day Surgery at [phone], Monday through Friday 6 am to 4 pm, or  at [facility] Main number at [phone].

## 2017-11-02 ENCOUNTER — Telehealth: Payer: Self-pay | Admitting: Surgery

## 2017-11-02 LAB — SURGICAL PATHOLOGY

## 2017-11-02 NOTE — Telephone Encounter (Signed)
Pt advised of pre op date/time and sx date. Sx: 11/01/17 with Dr Davis--Laparoscopic cholecystectomy.  Pre op: Day of sx.   Patient made aware to call [phone], between 1-3:00pm the day before surgery, to find out what time to arrive.

## 2017-11-04 ENCOUNTER — Telehealth: Payer: Self-pay

## 2017-11-04 NOTE — Telephone Encounter (Signed)
Patient called and stated that he is having "sharpe gas pain" under his rib cage. He states that he has not had a full bowel movement and wanted to know if it was normal to have this pain. He denied any nausea, vomiting, fever and chills. He stated that he has been eating well and being mobile. I advised him to try some Miralax to help him have a full bowel movement and to increase his water intake as well. I did advise that if his pain isn't better to give our office a call on Monday to have him to be seen sooner. He verbalized understanding.

## 2017-11-10 DIAGNOSIS — K8012 Calculus of gallbladder with acute and chronic cholecystitis without obstruction: Secondary | ICD-10-CM

## 2017-11-21 ENCOUNTER — Encounter: Payer: Self-pay | Admitting: Surgery

## 2017-11-21 ENCOUNTER — Ambulatory Visit (INDEPENDENT_AMBULATORY_CARE_PROVIDER_SITE_OTHER): Payer: Managed Care, Other (non HMO) | Admitting: Surgery

## 2017-11-21 VITALS — BP 135/84 | HR 71 | Temp 97.6°F | Ht 69.0 in | Wt 193.6 lb

## 2017-11-21 DIAGNOSIS — K8020 Calculus of gallbladder without cholecystitis without obstruction: Secondary | ICD-10-CM

## 2017-11-21 NOTE — Progress Notes (Signed)
Outpatient postop visit  11/21/2017  Thomas Parks is an 57 y.o. male.    Procedure: Laparoscopic cholecystectomy  CC: Feels well  HPI: This patient underwent a laparoscopic cholecystectomy by Dr. Earlene Plateravis for acute gangrenous cholecystitis confirmed on pathology. Patient feels well is tolerating a diet and went back to work 2 days after surgery. Medications reviewed.    Physical Exam:  BP 135/84   Pulse 71   Temp 97.6 F (36.4 C) (Oral)   Ht 5\' 9"  (1.753 m)   Wt 193 lb 9.6 oz (87.8 kg)   BMI 28.59 kg/m     PE: No icterus no jaundice abdomen is soft nontender wounds are clean no erythema no drainage    Assessment/Plan:  Pathology is reviewed.  Patient doing very well he will follow-up on an as-needed basis.  Lattie Hawichard E Adia Crammer, MD, FACS

## 2017-11-21 NOTE — Patient Instructions (Signed)

## 2018-05-13 IMAGING — CT CT ABD-PELV W/ CM
2 of 5 series · 16 of 46 positions shown, 18 images · IV contrast (APPLIED)
Comparison: Abdominal ultrasound October 26, 2017 at 8683 hours.

CLINICAL DATA: Upper abdominal pain, nausea and vomiting beginning
this evening.

EXAM:
CT ABDOMEN AND PELVIS WITH CONTRAST
TECHNIQUE: Multidetector CT imaging of the abdomen and pelvis was performed
using the standard protocol following bolus administration of
intravenous contrast.
CONTRAST:  100mL [id number] IOPAMIDOL ([id number]) INJECTION 61%

[Series 2: routine abd/pel with · axial · 0.75mm/px · z∈[-990,-570]mm · 13 of 94 slices shown, 15 images]
[im 5/94  soft-tissue]
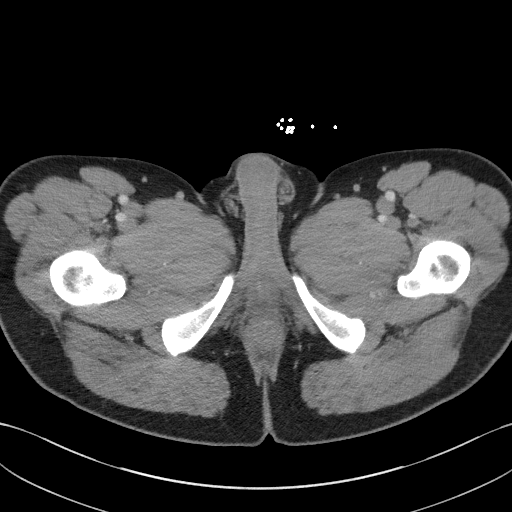
[im 5/94  bone]
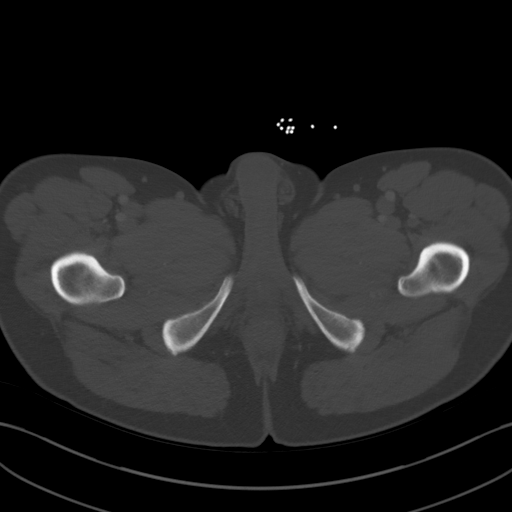
[im 15/94  soft-tissue]
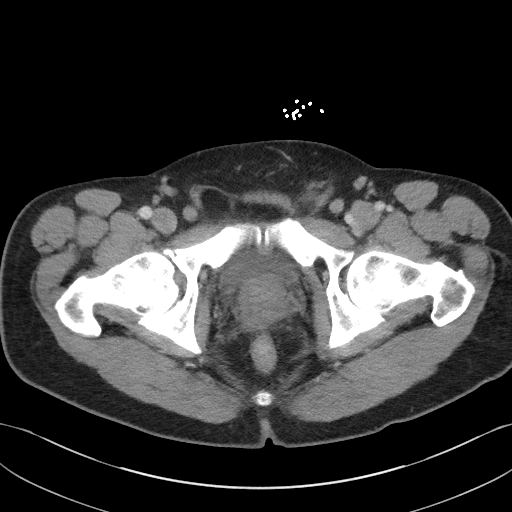
[im 20/94  soft-tissue]
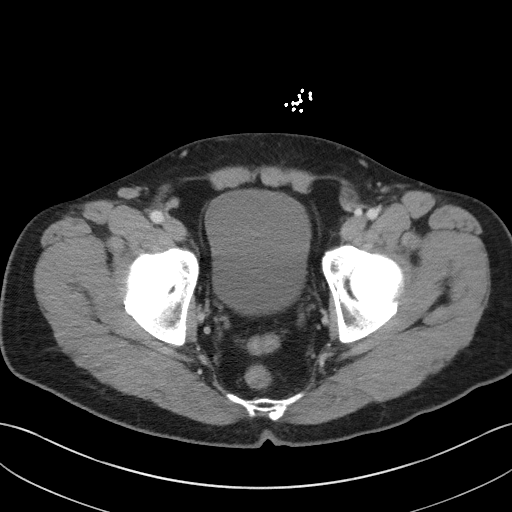
[im 25/94  soft-tissue]
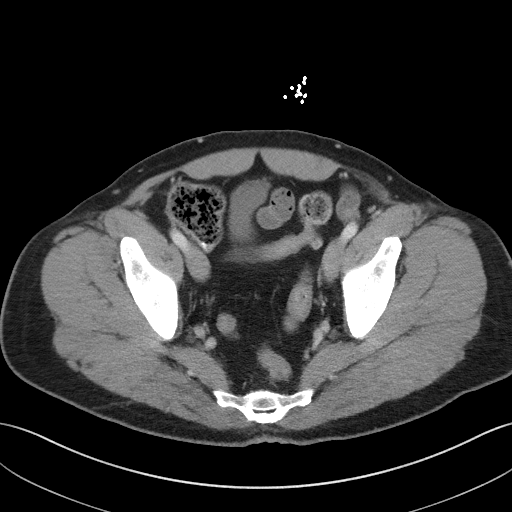
[im 35/94  soft-tissue]
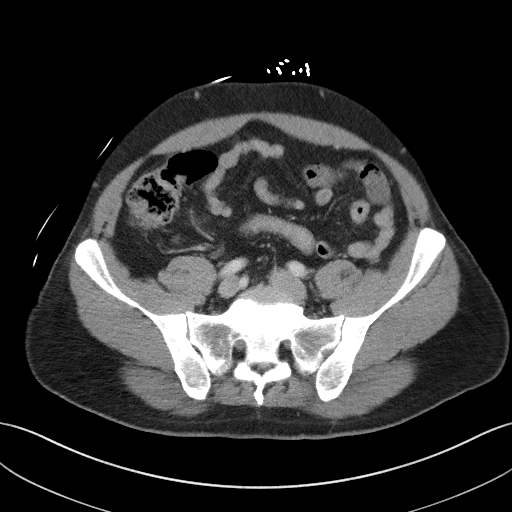
[im 40/94  soft-tissue]
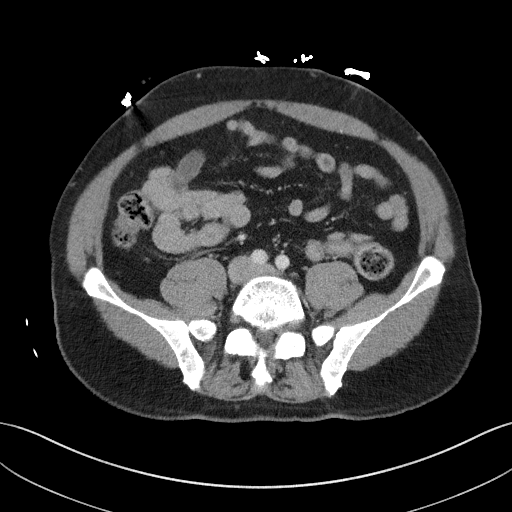
[im 49/94  soft-tissue]
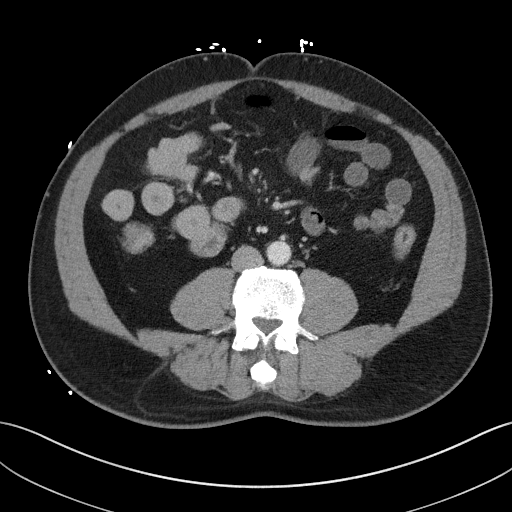
[im 54/94  soft-tissue]
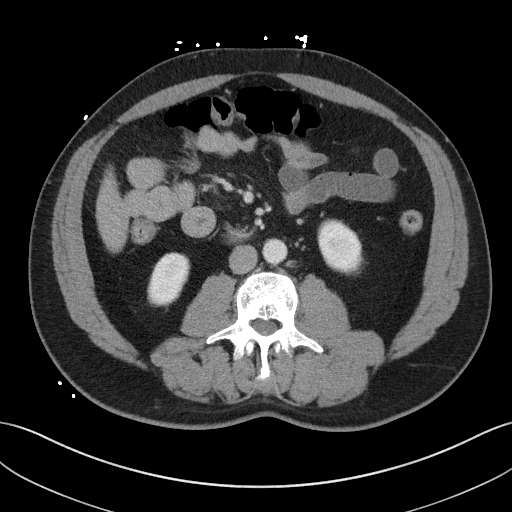
[im 59/94  soft-tissue]
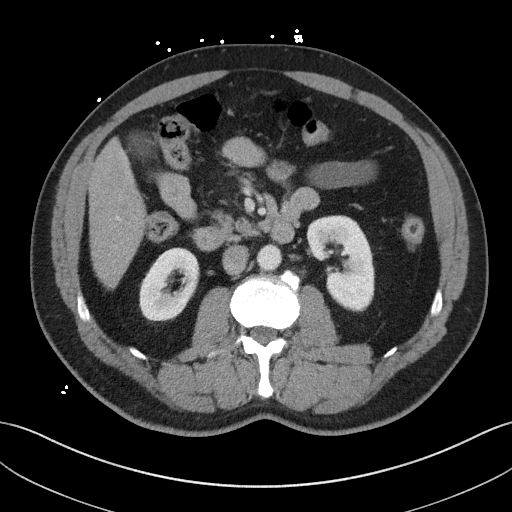
[im 59/94  bone]
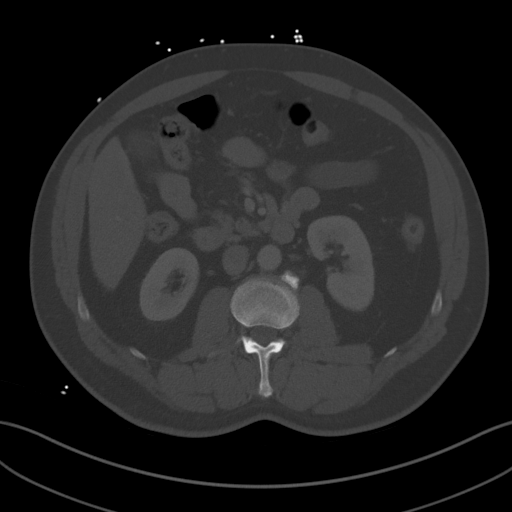
[im 69/94  soft-tissue]
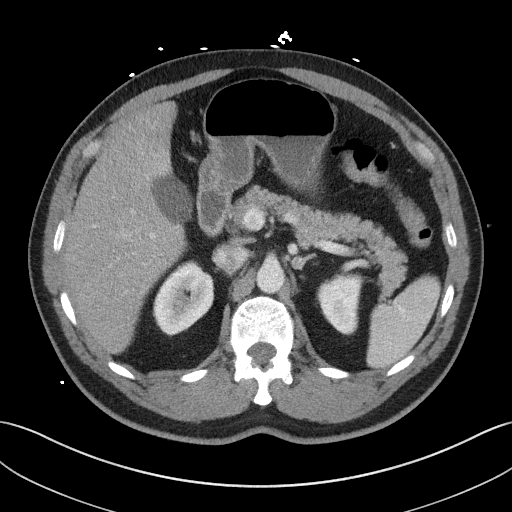
[im 74/94  soft-tissue]
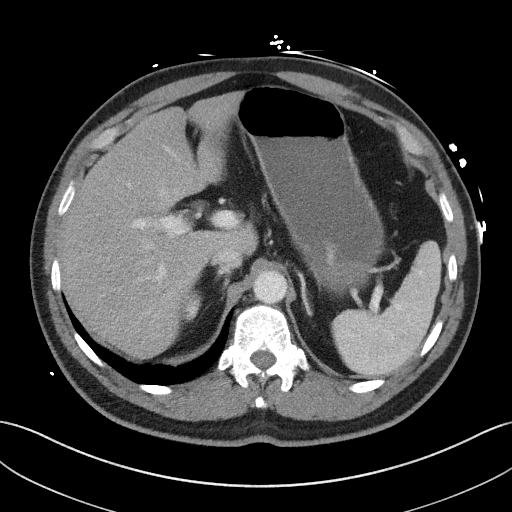
[im 79/94  soft-tissue]
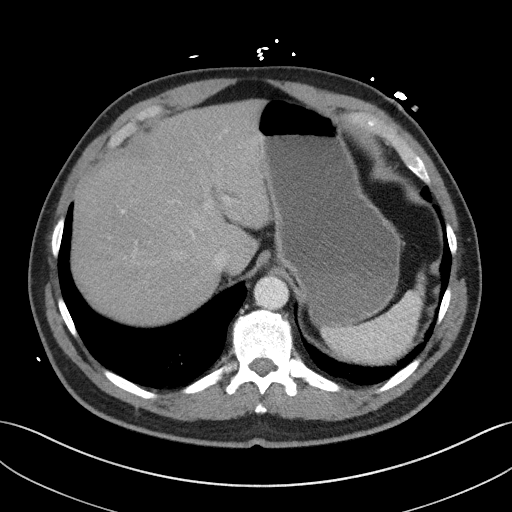
[im 89/94  soft-tissue]
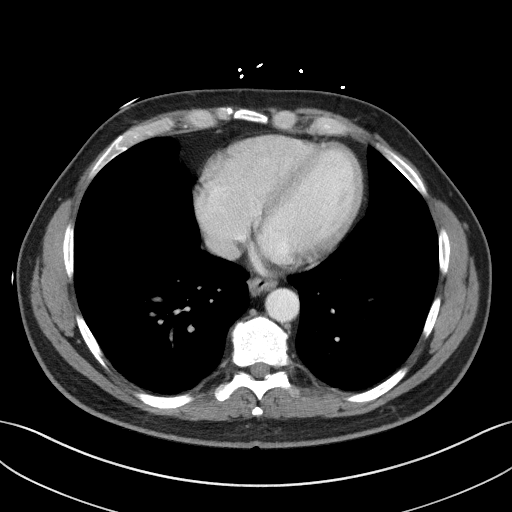

[Series 5: coronal st · coronal · 0.77mm/px · 3 of 96 slices shown]
[im 32/96  soft-tissue]
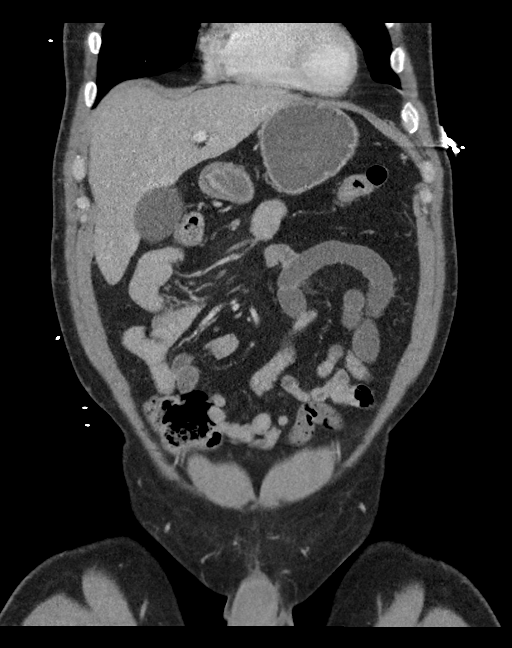
[im 43/96  soft-tissue]
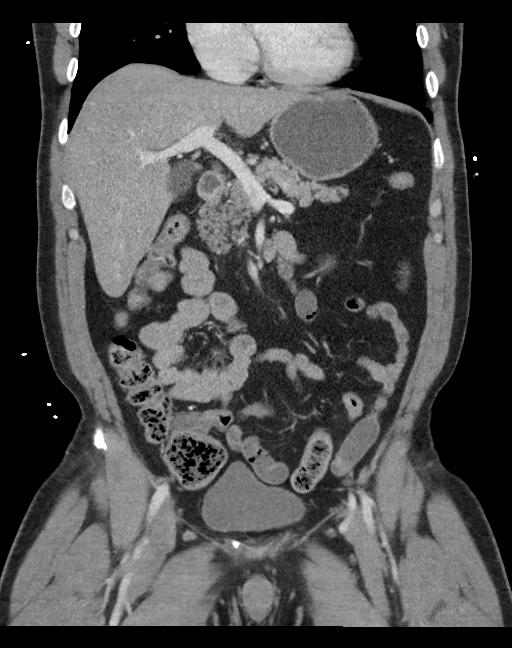
[im 53/96  soft-tissue]
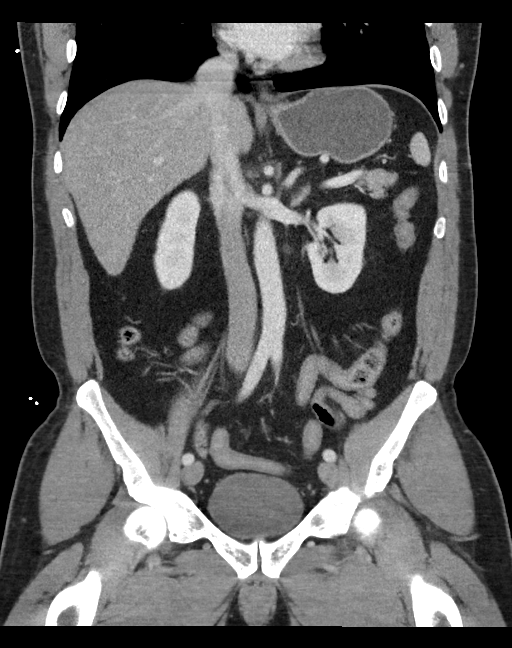

[16 of 46 positions shown; findings below may reference images not displayed]

FINDINGS: LOWER CHEST: Dependent atelectasis. Included heart size is normal.
No pericardial effusion.

HEPATOBILIARY: Diffusely hypodense liver compatible with steatosis,
mild focal fatty sparing about the gallbladder fossa. Known
gallstones are not apparent by CT. No CT findings of acute
cholecystitis.

PANCREAS: Normal.

SPLEEN: Normal.

ADRENALS/URINARY TRACT: Kidneys are orthotopic, demonstrating
symmetric enhancement. No nephrolithiasis, hydronephrosis or solid
renal masses. The unopacified ureters are normal in course and
caliber. Delayed imaging through the kidneys demonstrates symmetric
prompt contrast excretion within the proximal urinary collecting
system. Urinary bladder is partially distended and unremarkable.
Normal adrenal glands.

STOMACH/BOWEL: The stomach, small and large bowel are normal in
course and caliber without inflammatory changes. A few scattered
colonic diverticula evident. Small probable duodenal diverticulum
anteriorly directed. Normal appendix.

VASCULAR/LYMPHATIC: Aortoiliac vessels are normal in course and
caliber. No lymphadenopathy by CT size criteria.

REPRODUCTIVE: Prostatomegaly invading base of bladder.

OTHER: No intraperitoneal free fluid or free air. Small bilateral
fat containing inguinal hernias.

MUSCULOSKELETAL: Nonacute.
IMPRESSION: 1. No acute intra-or pelvic process. Known cholelithiasis not
apparent by CT.
2. Prostatomegaly.

## 2024-06-15 ENCOUNTER — Encounter (INDEPENDENT_AMBULATORY_CARE_PROVIDER_SITE_OTHER): Payer: Self-pay

## 2024-06-29 ENCOUNTER — Other Ambulatory Visit: Payer: Self-pay

## 2024-06-29 MED ORDER — FLUZONE 0.5 ML IM SUSY
0.5000 mL | PREFILLED_SYRINGE | Freq: Once | INTRAMUSCULAR | 0 refills | Status: AC
  Filled 2024-06-29: qty 0.5, 1d supply, fill #0
